# Patient Record
Sex: Female | Born: 1950 | Race: White | Hispanic: No | Marital: Married | Smoking: Never smoker
Health system: Southern US, Community
[De-identification: ages and names within clinical notes are randomized; demographics above are authoritative.]

## PROBLEM LIST (undated history)

## (undated) DIAGNOSIS — C801 Malignant (primary) neoplasm, unspecified: Secondary | ICD-10-CM

## (undated) HISTORY — PX: BREAST CYST ASPIRATION: SHX578

---

## 2006-02-17 ENCOUNTER — Ambulatory Visit: Payer: Self-pay | Admitting: Certified Nurse Midwife

## 2012-07-28 DIAGNOSIS — C801 Malignant (primary) neoplasm, unspecified: Secondary | ICD-10-CM

## 2012-07-28 HISTORY — DX: Malignant (primary) neoplasm, unspecified: C80.1

## 2012-12-02 ENCOUNTER — Ambulatory Visit: Payer: Self-pay | Admitting: Obstetrics and Gynecology

## 2013-01-07 DIAGNOSIS — Z905 Acquired absence of kidney: Secondary | ICD-10-CM | POA: Insufficient documentation

## 2013-01-11 DIAGNOSIS — N1330 Unspecified hydronephrosis: Secondary | ICD-10-CM | POA: Insufficient documentation

## 2013-01-11 DIAGNOSIS — I10 Essential (primary) hypertension: Secondary | ICD-10-CM | POA: Insufficient documentation

## 2013-02-10 DIAGNOSIS — C669 Malignant neoplasm of unspecified ureter: Secondary | ICD-10-CM | POA: Insufficient documentation

## 2013-05-02 ENCOUNTER — Ambulatory Visit: Payer: Self-pay | Admitting: Family Medicine

## 2013-06-01 ENCOUNTER — Ambulatory Visit: Payer: Self-pay | Admitting: Family Medicine

## 2015-03-02 IMAGING — US TRANSABDOMINAL ULTRASOUND OF PELVIS
1 series · 13 of 25 positions shown · non-contrast
Comparison: none

REASON FOR EXAM: PM Spotting Check Stripe
COMMENTS:

[Series 1: transabdominal ultrasound of pelvis · 0.30mm/px · 13 of 104 slices shown]
[im 1/104]
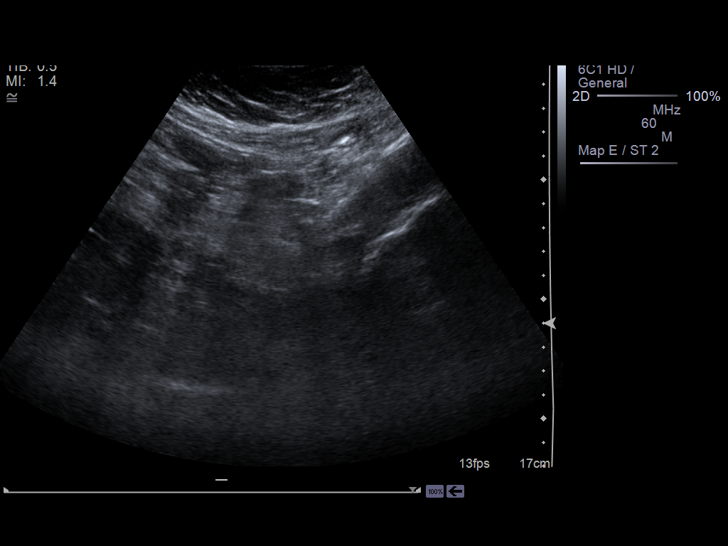
[im 9/104]
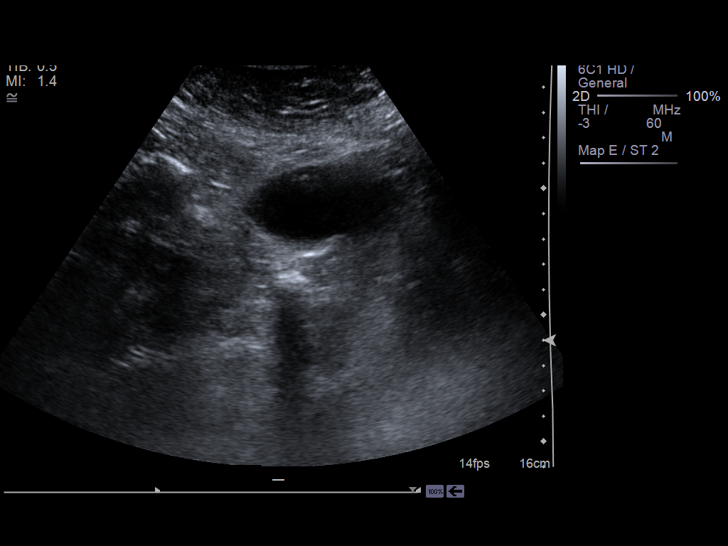
[im 18/104]
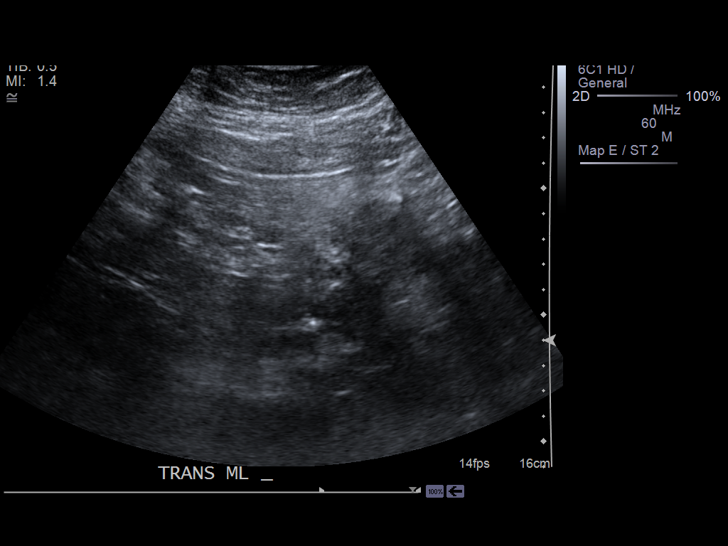
[im 26/104]
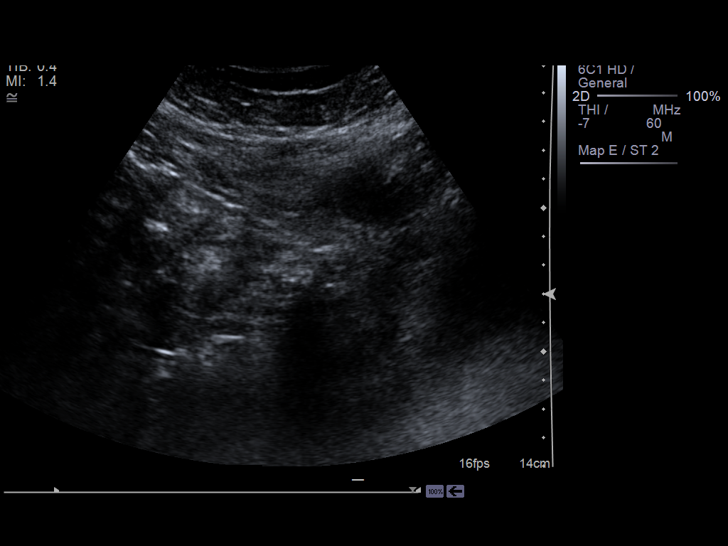
[im 35/104]
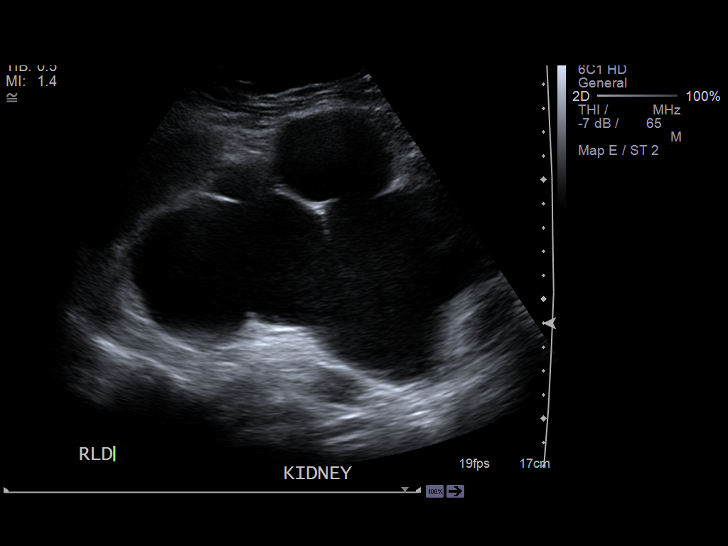
[im 43/104]
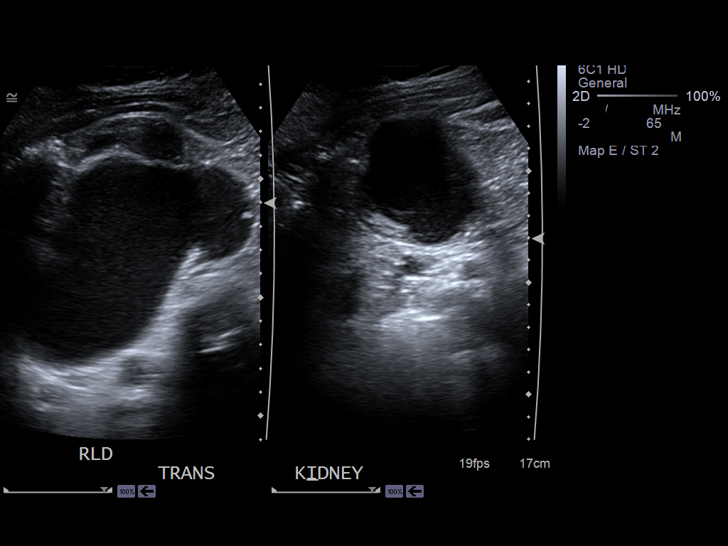
[im 52/104]
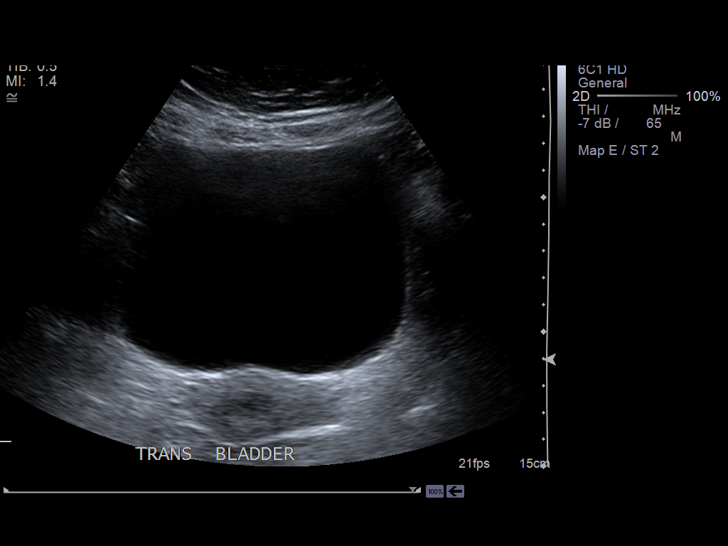
[im 61/104]
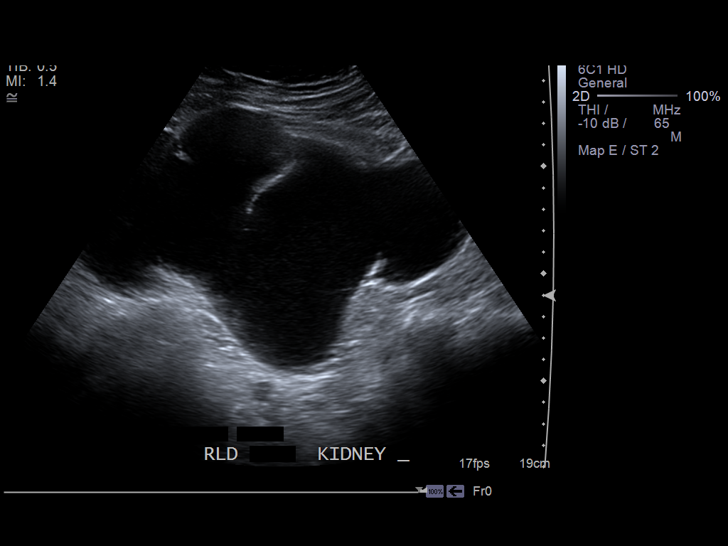
[im 69/104]
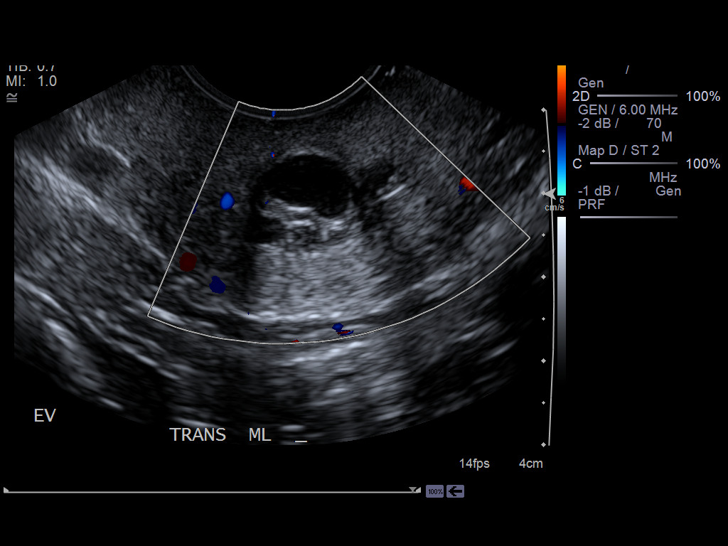
[im 78/104]
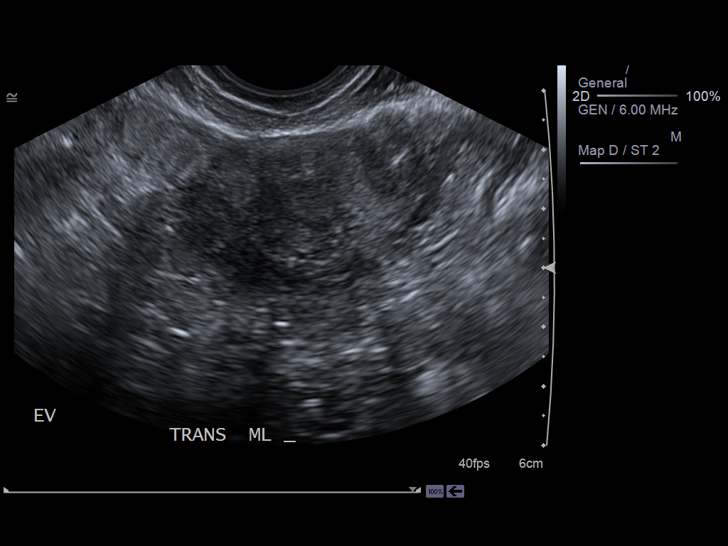
[im 86/104]
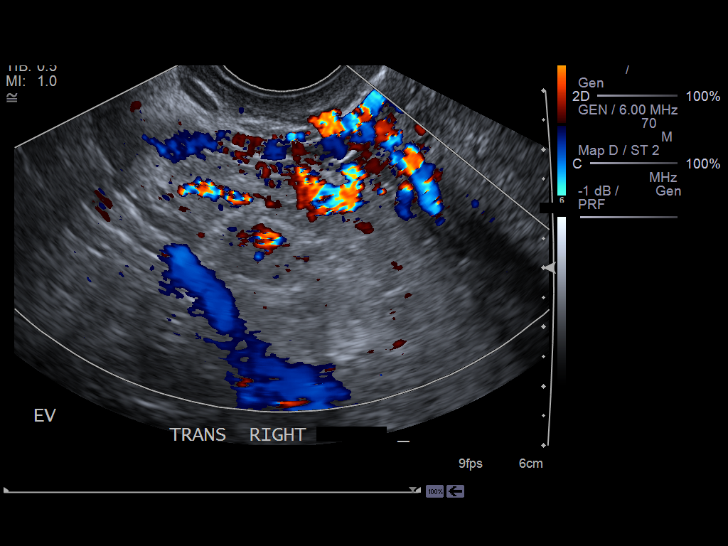
[im 95/104]
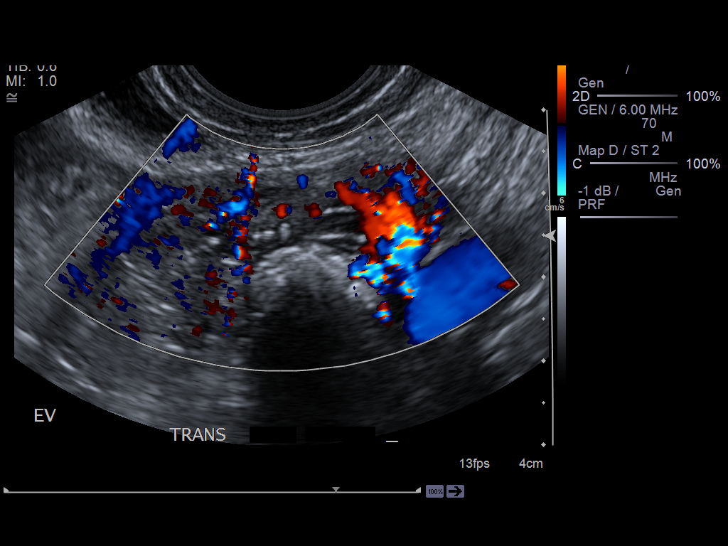
[im 104/104]
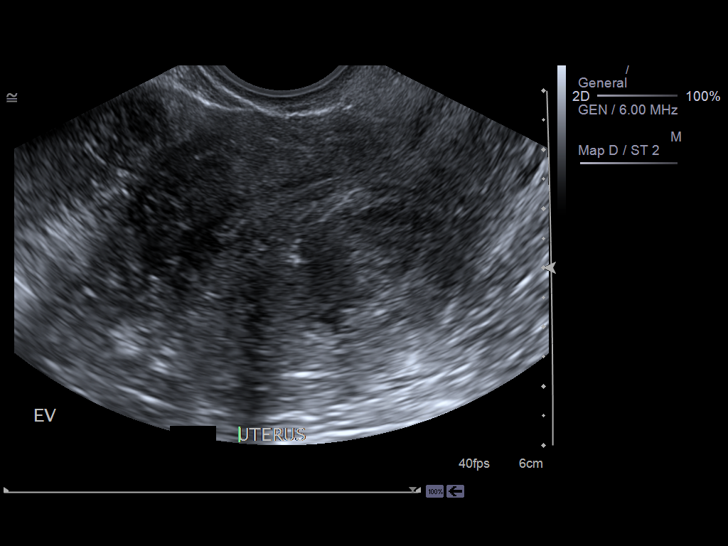

[13 of 25 positions shown; findings below may reference images not displayed]

PROCEDURE:     BETILIJA - BETILIJA PELVIS NON-OB W/TRANSVAGINAL  - December 02, 2012  [DATE]

RESULT:     Transabdominal and endovaginal pelvic sonogram is performed. The
there is severe left hydronephrosis with an ectatic ureter which is likely
indicative of chronic high-grade obstruction. Further investigation is
recommended. The right kidney appears normal. Right ureteral jet is noted at
the moderately distended urinary bladder. Left ureteral jet could not be
visualized. Right kidney length is 10.69 cm. Post void bladder volume is
20.9 cc consistent with a small residual volume present. The length of the
left kidney with the severely distended collecting system is the 18.82 cm.

There is a nabothian cyst present measuring approximately 9.5 to 10.9 mm in
length. Endometrial stripe thickness is 0.59 cm on endovaginal scanning. The
uterus itself is 9.28 x 4.92 x 5.95 cm. The myometrial echo texture shows
some areas of heterogeneous overall slightly increased echogenicity
including one posteriorly slightly to the left in the mid uterine region
measuring 3.02 x 1.63 x 2.53 cm. There is a second smaller area near the
fundus measuring 1.08 x 0.72 x 1.44 cm. These are suggestive of probable
leiomyomata. The right ovary could not be visualized on transabdominal or
endovaginal imaging. The left ovary measures 2.14 x 0.83 x 1.35 cm with
evidence by Doppler interrogation of blood flow.
IMPRESSION: 1. Likely chronic high-grade obstruction in the left ureter or ureteropelvic
junction. There is severe hydronephrosis. Further investigation with CT is
recommended. The right kidney appears normal.
2. Posterior and fundal fibroids as described. Endometrial stripe thickness
is 5.9 mm. There is no free fluid or abnormal fluid collection.
3. Normal appearing left ovary. Some minimal calcification may be present
within the left ovary.
4. The right ovary could not be demonstrated.
5. Small post void residual volume.
6. Nabothian cysts present.

[REDACTED]

## 2015-07-31 IMAGING — MG MM DIGITAL SCREENING BILAT W/ CAD
1 series · 4 of 4 positions shown · non-contrast
Comparison: Previous exam(s)

ACR Breast Density Category b: The breasts are scattered
fibroglandular.

REASON FOR EXAM: SCR MAMMO NO ORDER
COMMENTS:

PROCEDURE:     MAM - MAM DGTL SCRN MAM NO ORDER W/CAD  - May 02, 2013  [DATE]
CLINICAL DATA: Screening.
EXAM:
DIGITAL SCREENING BILATERAL MAMMOGRAM WITH CAD

[R CC · right · 4 of 4 slices shown]
[im 1/4]
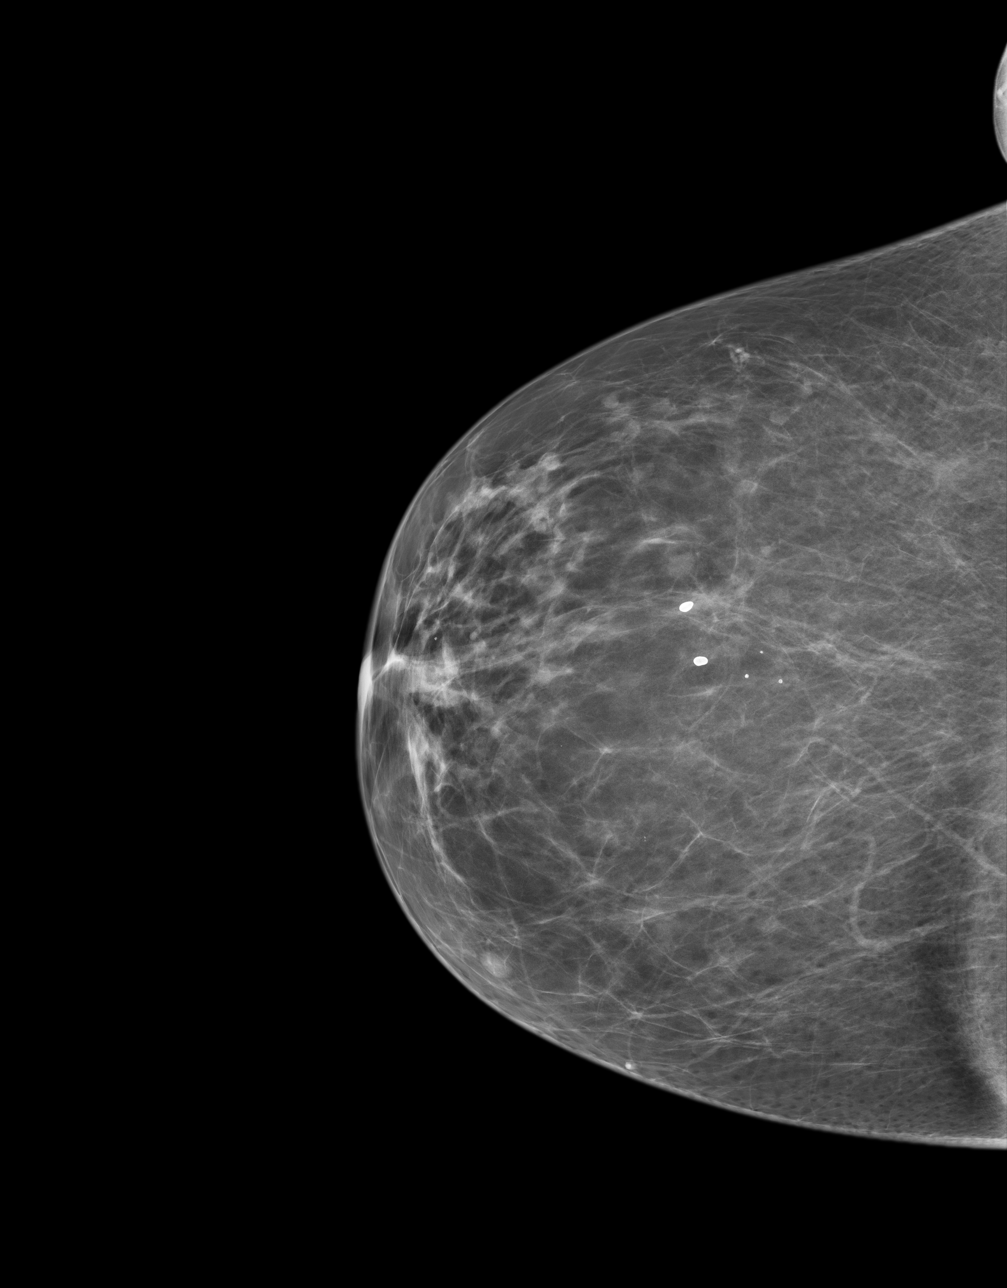
[im 2/4]
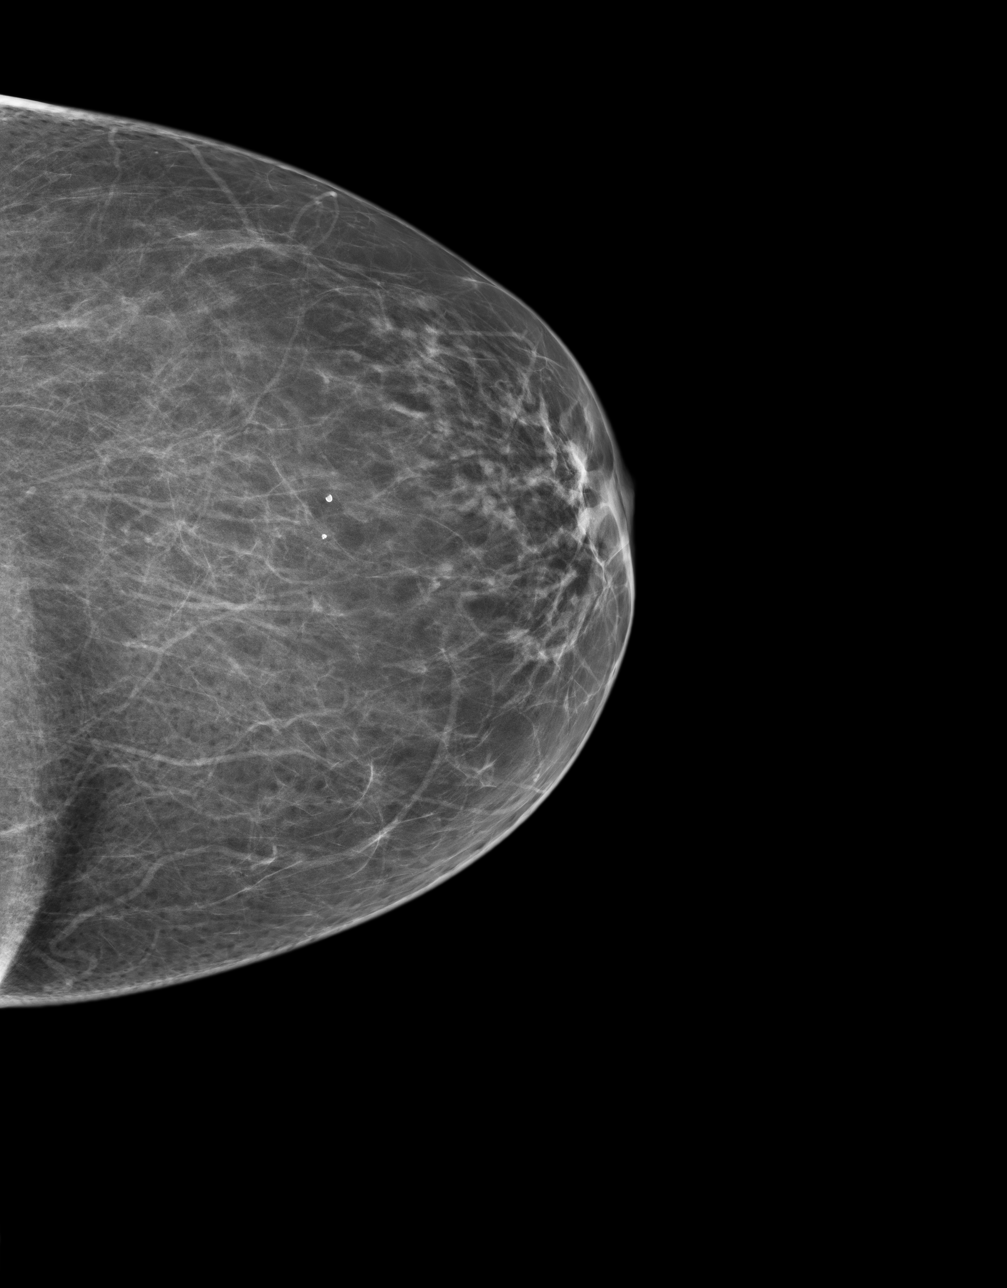
[im 3/4]
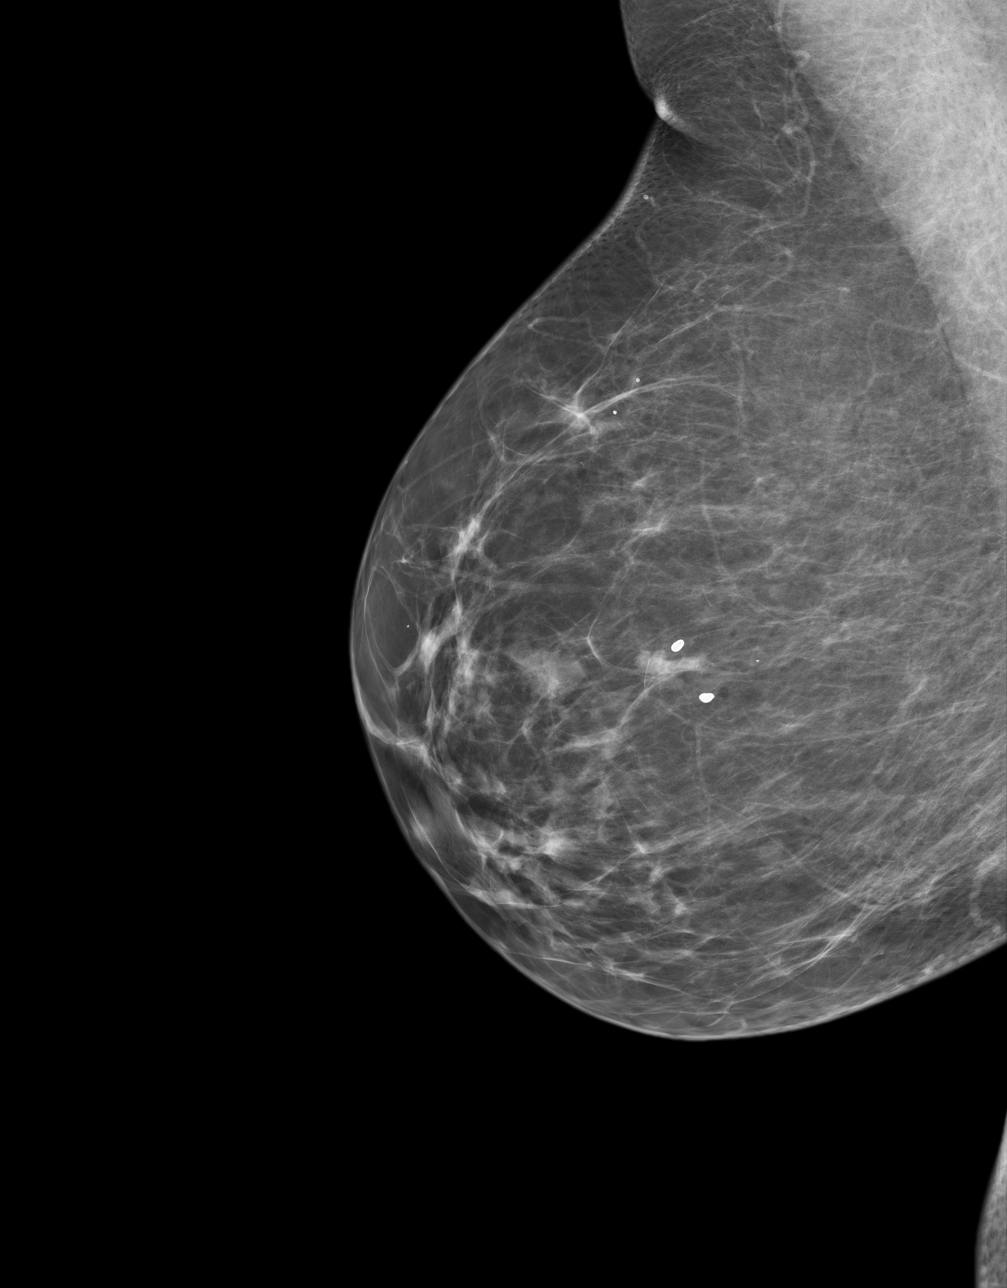
[im 4/4]
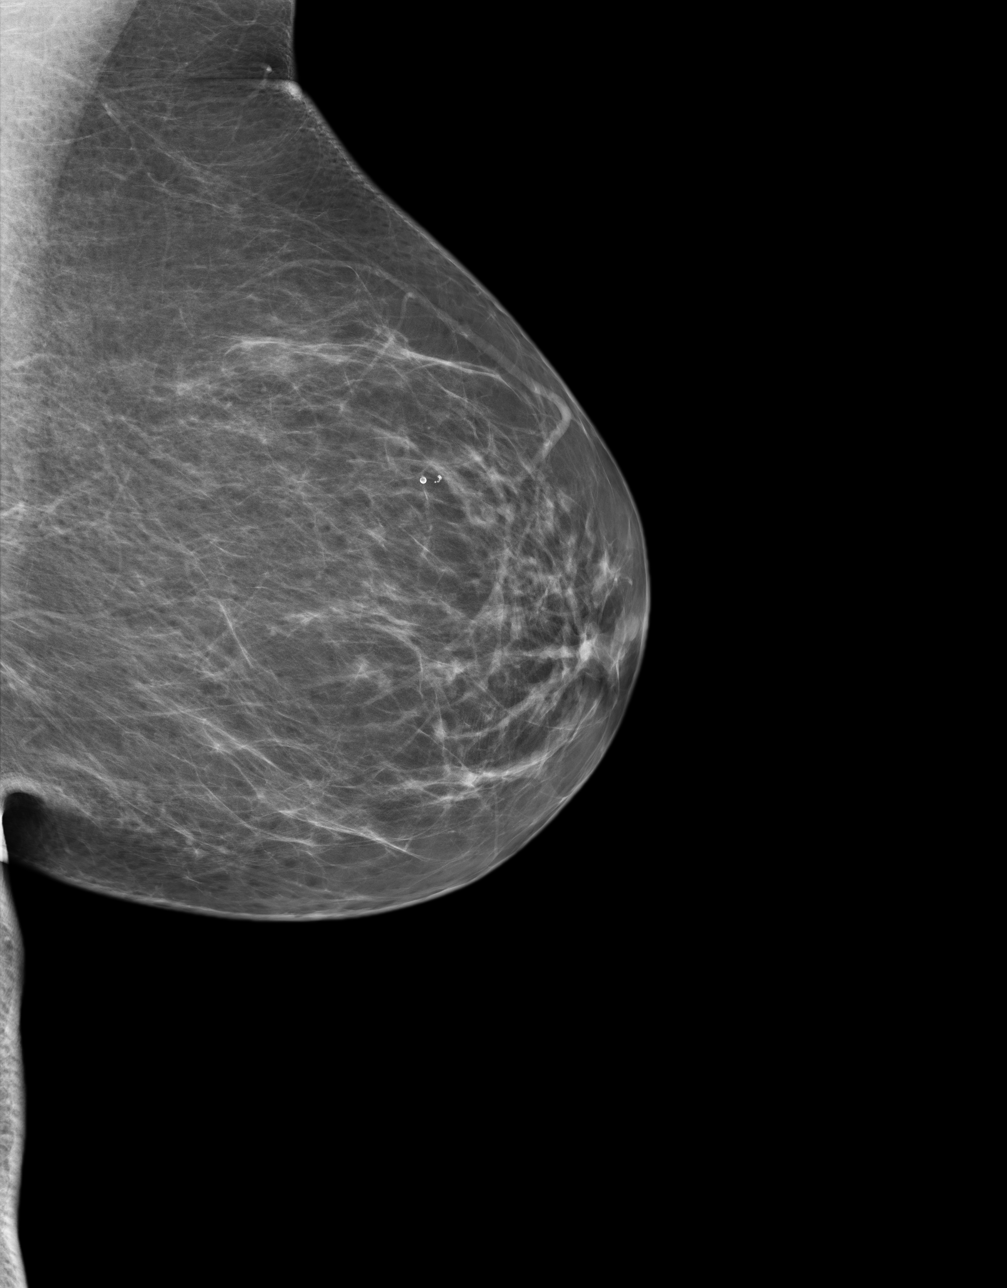

[4 of 4 positions shown; findings below may reference images not displayed]

FINDINGS: In the right breast, a possible mass warrants further evaluation
with spot compression views and possibly ultrasound. In the left
breast, no masses or malignant type calcifications are identified.
Images were processed with CAD.
IMPRESSION: Further evaluation is suggested for possible mass in the right
breast.

RECOMMENDATION:
Diagnostic mammogram and possibly ultrasound of the right breast.
(Code:[id number])

The patient will be contacted regarding the findings, and additional
imaging will be scheduled.

BI-RADS CATEGORY  0: Incomplete. Need additional imaging evaluation
and/or prior mammograms for comparison.

## 2015-11-26 DIAGNOSIS — E119 Type 2 diabetes mellitus without complications: Secondary | ICD-10-CM | POA: Insufficient documentation

## 2016-01-17 ENCOUNTER — Encounter: Payer: Self-pay | Admitting: Dietician

## 2016-01-17 ENCOUNTER — Encounter: Payer: BLUE CROSS/BLUE SHIELD | Attending: Family Medicine | Admitting: Dietician

## 2016-01-17 VITALS — Ht 64.0 in | Wt 184.1 lb

## 2016-01-17 DIAGNOSIS — E119 Type 2 diabetes mellitus without complications: Secondary | ICD-10-CM

## 2016-01-17 DIAGNOSIS — Z8619 Personal history of other infectious and parasitic diseases: Secondary | ICD-10-CM | POA: Insufficient documentation

## 2016-01-17 DIAGNOSIS — D249 Benign neoplasm of unspecified breast: Secondary | ICD-10-CM | POA: Insufficient documentation

## 2016-01-17 NOTE — Progress Notes (Signed)
Medical Nutrition Therapy: Visit start time: 1500  end time: 1600  Assessment:  Diagnosis: Type 2DM Past medical history: HTN, HLD Psychosocial issues/ stress concerns: reports stress at work and big decisions at home; craves chocolate/ comfort foods when stressed Preferred learning method:  . No preference indicated  Current weight: 184.1lbs  Height: 5'4" Medications, supplements: reviewed list in chart with patient  Progress and evaluation: Patient reports Diabetes diagnosis in 2014 after ureter surgery. Recent HbA1C of 9.5% per MD notes.         She reports increase in stress recently and less healthy food choices are likely reasons for increase in BGs.          She feels that wheat products and rice increase her blood sugar easily.          She would like to lose weight, her goal weight is 130lbs.    Physical activity: walking 30 minutes, 2 times a week.  Dietary Intake:  Usual eating pattern includes 2 meals and 0-1 snacks per day. Dining out frequency: 3 meals per week.  Breakfast: usually black coffee Snack: none Lunch: chicken or other meat (trying to limit breading), no-bread sandwiches except occasional biscuit or french fries.  Snack: none; occasional latte Supper: grilled chicken or fish, rice, sometimes beans, sometimes sandwich with sandwich round bread.  Snack: has been eating some Nutty bar ice cream cones, but has now stopped Beverages: water, unsweet tea, coffee. Does not use artificial sweeteners.   Nutrition Care Education: Topics covered: Diabetes, weight control Basic nutrition: basic food groups, appropriate nutrient balance, appropriate meal and snack schedule, general nutrition guidelines    Weight control: 1300kcal meal guide, behavioral changes for weight loss: portion control, food cravings, importance of lowfat and low sugar choices.  Diabetes:  goals for BGs, appropriate meal and snack schedule, appropriate carb intake and balance Other lifestyle  changes: role of exercise in BG control, BP control, and stress management  Nutritional Diagnosis:  [place]-3.3 Overweight/obesity As related to inactivity, excess calories.  As evidenced by patient report.  Intervention: Instruction as noted above.   Set goals with patient input.    Encouraged focus on balance of nutrients rather than avoiding any particular foods entirely.    Patient did not feel RD follow-up would be necessary at this time.  Education Materials given:  . General diet guidelines for Diabetes . Food lists/ Planning A Balanced Meal  Diabetes and You ([employer])  Carbohydrates and Meal Planning [employer]) . Goals/ instructions  Learner/ who was taught:  . Patient   Level of understanding: Marland Kitchen Verbalizes/ demonstrates competency  Demonstrated degree of understanding via:   Teach back Learning barriers: . None  Willingness to learn/ readiness for change: . Eager, change in progress  Monitoring and Evaluation:  Dietary intake, exercise, BG control, and body weight      follow up: prn

## 2016-01-17 NOTE — Patient Instructions (Signed)
   Start measuring food portions, especially of starches. Allow for generous portions of vegetables.   Try a small breakfast, such as oatmeal with a few nuts and small amount of fruit (refrigerator oatmeal: 1/2 cup oats + 3/4-1 cup milk or water, and allow to soften overnight in the fridge. Add fruit, nuts when you are ready to eat)  Include some exercise ideally 4 days a week. This will help with blood sugar, blood pressure, and stress.

## 2016-07-22 ENCOUNTER — Other Ambulatory Visit: Payer: Self-pay | Admitting: Family Medicine

## 2016-07-22 DIAGNOSIS — Z1231 Encounter for screening mammogram for malignant neoplasm of breast: Secondary | ICD-10-CM

## 2016-08-04 ENCOUNTER — Ambulatory Visit: Admission: RE | Admit: 2016-08-04 | Payer: BLUE CROSS/BLUE SHIELD | Source: Ambulatory Visit

## 2016-08-11 ENCOUNTER — Encounter: Payer: Self-pay | Admitting: Radiology

## 2016-08-11 ENCOUNTER — Ambulatory Visit
Admission: RE | Admit: 2016-08-11 | Discharge: 2016-08-11 | Disposition: A | Discharge status: Home / Self Care | Payer: Medicare Other | Source: Ambulatory Visit | Attending: Family Medicine | Admitting: Family Medicine

## 2016-08-11 DIAGNOSIS — Z1231 Encounter for screening mammogram for malignant neoplasm of breast: Secondary | ICD-10-CM

## 2016-08-11 HISTORY — DX: Malignant (primary) neoplasm, unspecified: C80.1

## 2016-08-18 ENCOUNTER — Other Ambulatory Visit: Payer: Self-pay | Admitting: Family Medicine

## 2016-08-18 DIAGNOSIS — R928 Other abnormal and inconclusive findings on diagnostic imaging of breast: Secondary | ICD-10-CM

## 2016-09-09 ENCOUNTER — Ambulatory Visit
Admission: RE | Admit: 2016-09-09 | Discharge: 2016-09-09 | Disposition: A | Discharge status: Home / Self Care | Payer: Medicare Other | Source: Ambulatory Visit | Attending: Family Medicine | Admitting: Family Medicine

## 2016-09-09 DIAGNOSIS — R921 Mammographic calcification found on diagnostic imaging of breast: Secondary | ICD-10-CM | POA: Diagnosis not present

## 2016-09-09 DIAGNOSIS — R928 Other abnormal and inconclusive findings on diagnostic imaging of breast: Secondary | ICD-10-CM

## 2019-07-05 ENCOUNTER — Other Ambulatory Visit: Payer: Self-pay

## 2019-07-05 DIAGNOSIS — Z20822 Contact with and (suspected) exposure to covid-19: Secondary | ICD-10-CM

## 2019-07-06 LAB — NOVEL CORONAVIRUS, NAA: SARS-CoV-2, NAA: NOT DETECTED

## 2019-07-20 ENCOUNTER — Ambulatory Visit: Payer: Self-pay

## 2019-08-22 ENCOUNTER — Ambulatory Visit (LOCAL_COMMUNITY_HEALTH_CENTER): Payer: Medicare Other

## 2019-08-22 ENCOUNTER — Other Ambulatory Visit: Payer: Self-pay

## 2019-08-22 DIAGNOSIS — Z23 Encounter for immunization: Secondary | ICD-10-CM

## 2019-08-22 NOTE — Progress Notes (Signed)
Prevnar given; tolerated well Charise Leinbach, RN  

## 2020-03-19 ENCOUNTER — Ambulatory Visit (LOCAL_COMMUNITY_HEALTH_CENTER): Payer: Medicare Other

## 2020-03-19 DIAGNOSIS — Z23 Encounter for immunization: Secondary | ICD-10-CM | POA: Diagnosis not present

## 2021-07-28 DIAGNOSIS — I219 Acute myocardial infarction, unspecified: Secondary | ICD-10-CM

## 2021-07-28 HISTORY — DX: Acute myocardial infarction, unspecified: I21.9

## 2021-08-10 DIAGNOSIS — I2102 ST elevation (STEMI) myocardial infarction involving left anterior descending coronary artery: Secondary | ICD-10-CM | POA: Insufficient documentation

## 2021-08-11 DIAGNOSIS — E785 Hyperlipidemia, unspecified: Secondary | ICD-10-CM | POA: Insufficient documentation

## 2021-08-11 DIAGNOSIS — I2510 Atherosclerotic heart disease of native coronary artery without angina pectoris: Secondary | ICD-10-CM | POA: Insufficient documentation

## 2021-08-13 DIAGNOSIS — I5022 Chronic systolic (congestive) heart failure: Secondary | ICD-10-CM | POA: Insufficient documentation

## 2021-09-09 ENCOUNTER — Other Ambulatory Visit: Payer: Self-pay

## 2021-09-09 ENCOUNTER — Encounter: Payer: Medicare Other | Attending: Cardiology | Admitting: *Deleted

## 2021-09-09 ENCOUNTER — Encounter: Payer: Self-pay | Admitting: *Deleted

## 2021-09-09 DIAGNOSIS — Z955 Presence of coronary angioplasty implant and graft: Secondary | ICD-10-CM | POA: Insufficient documentation

## 2021-09-09 DIAGNOSIS — I213 ST elevation (STEMI) myocardial infarction of unspecified site: Secondary | ICD-10-CM | POA: Insufficient documentation

## 2021-09-09 NOTE — Progress Notes (Signed)
Initial telephone orientation completed. Diagnosis can be found in [facility] 1/13. EP orientation scheduled for Wednesday 2/15 at 10:30

## 2021-09-11 ENCOUNTER — Other Ambulatory Visit: Payer: Self-pay

## 2021-09-11 VITALS — Ht 64.5 in | Wt 152.0 lb

## 2021-09-11 DIAGNOSIS — I213 ST elevation (STEMI) myocardial infarction of unspecified site: Secondary | ICD-10-CM

## 2021-09-11 DIAGNOSIS — Z955 Presence of coronary angioplasty implant and graft: Secondary | ICD-10-CM | POA: Diagnosis not present

## 2021-09-11 NOTE — Patient Instructions (Signed)
Patient Instructions  Patient Details  Name: Kara Dyer MRN: [id number] Date of Birth: [birth year] Referring Provider:  Joaquim Nam, MD  Below are your personal goals for exercise, nutrition, and risk factors. Our goal is to help you stay on track towards obtaining and maintaining these goals. We will be discussing your progress on these goals with you throughout the program.  Initial Exercise Prescription:  Initial Exercise Prescription - 09/11/21 1200       Date of Initial Exercise RX and Referring Provider   Date 09/11/21    Referring Provider Lillia Corporal, Artelia Laroche MD      Oxygen   Maintain Oxygen Saturation 88% or higher      NuStep   Level 2    SPM 80    Minutes 15    METs 2.7      T5 Nustep   Level 1    SPM 80    Minutes 15    METs 2.7      Biostep-RELP   Level 1    SPM 50    Minutes 15    METs 2.7      Track   Laps 28    Minutes 15    METs 2.52      Prescription Details   Frequency (times per week) 3    Duration Progress to 30 minutes of continuous aerobic without signs/symptoms of physical distress      Intensity   THRR 40-80% of Max Heartrate 100 - 133    Ratings of Perceived Exertion 11-13    Perceived Dyspnea 0-4      Progression   Progression Continue to progress workloads to maintain intensity without signs/symptoms of physical distress.      Resistance Training   Training Prescription Yes    Weight 3 lb    Reps 10-15             Exercise Goals: Frequency: Be able to perform aerobic exercise two to three times per week in program working toward 2-5 days per week of home exercise.  Intensity: Work with a perceived exertion of 11 (fairly light) - 15 (hard) while following your exercise prescription.  We will make changes to your prescription with you as you progress through the program.   Duration: Be able to do 30 to 45 minutes of continuous aerobic exercise in addition to a 5 minute warm-up and a 5 minute cool-down routine.    Nutrition Goals: Your personal nutrition goals will be established when you do your nutrition analysis with the dietician.  The following are general nutrition guidelines to follow: Cholesterol < 200mg /day Sodium < 1500mg /day Fiber: Women over 50 yrs - 21 grams per day  Personal Goals:  Personal Goals and Risk Factors at Admission - 09/11/21 1233       Core Components/Risk Factors/Patient Goals on Admission    Weight Management Yes;Weight Maintenance    Intervention Weight Management: Develop a combined nutrition and exercise program designed to reach desired caloric intake, while maintaining appropriate intake of nutrient and fiber, sodium and fats, and appropriate energy expenditure required for the weight goal.;Weight Management: Provide education and appropriate resources to help participant work on and attain dietary goals.;Weight Management/Obesity: Establish reasonable short term and long term weight goals.    Admit Weight 152 lb (68.9 kg)    Goal Weight: Short Term 152 lb (68.9 kg)    Goal Weight: Long Term 152 lb (68.9 kg)    Expected Outcomes Short Term: Continue to  assess and modify interventions until short term weight is achieved;Long Term: Adherence to nutrition and physical activity/exercise program aimed toward attainment of established weight goal;Weight Maintenance: Understanding of the daily nutrition guidelines, which includes 25-35% calories from fat, 7% or less cal from saturated fats, less than 200mg  cholesterol, less than 1.5gm of sodium, & 5 or more servings of fruits and vegetables daily;Understanding recommendations for meals to include 15-35% energy as protein, 25-35% energy from fat, 35-60% energy from carbohydrates, less than 200mg  of dietary cholesterol, 20-35 gm of total fiber daily;Understanding of distribution of calorie intake throughout the day with the consumption of 4-5 meals/snacks    Diabetes Yes    Intervention Provide education about signs/symptoms and  action to take for hypo/hyperglycemia.;Provide education about proper nutrition, including hydration, and aerobic/resistive exercise prescription along with prescribed medications to achieve blood glucose in normal ranges: Fasting glucose 65-99 mg/dL    Expected Outcomes Short Term: Participant verbalizes understanding of the signs/symptoms and immediate care of hyper/hypoglycemia, proper foot care and importance of medication, aerobic/resistive exercise and nutrition plan for blood glucose control.;Long Term: Attainment of HbA1C < 7%.    Hypertension Yes    Intervention Provide education on lifestyle modifcations including regular physical activity/exercise, weight management, moderate sodium restriction and increased consumption of fresh fruit, vegetables, and low fat dairy, alcohol moderation, and smoking cessation.;Monitor prescription use compliance.    Lipids Yes    Intervention Provide education and support for participant on nutrition & aerobic/resistive exercise along with prescribed medications to achieve LDL 70mg , HDL >40mg .    Expected Outcomes Short Term: Participant states understanding of desired cholesterol values and is compliant with medications prescribed. Participant is following exercise prescription and nutrition guidelines.;Long Term: Cholesterol controlled with medications as prescribed, with individualized exercise RX and with personalized nutrition plan. Value goals: LDL < 70mg , HDL > 40 mg.             Tobacco Use Initial Evaluation: Social History   Tobacco Use  Smoking Status Never  Smokeless Tobacco Not on file    Exercise Goals and Review:  Exercise Goals     Row Name 09/11/21 1232             Exercise Goals   Increase Physical Activity Yes       Intervention Provide advice, education, support and counseling about physical activity/exercise needs.;Develop an individualized exercise prescription for aerobic and resistive training based on initial  evaluation findings, risk stratification, comorbidities and participant's personal goals.       Expected Outcomes Short Term: Attend rehab on a regular basis to increase amount of physical activity.;Long Term: Add in home exercise to make exercise part of routine and to increase amount of physical activity.;Long Term: Exercising regularly at least 3-5 days a week.       Increase Strength and Stamina Yes       Intervention Provide advice, education, support and counseling about physical activity/exercise needs.;Develop an individualized exercise prescription for aerobic and resistive training based on initial evaluation findings, risk stratification, comorbidities and participant's personal goals.       Expected Outcomes Short Term: Increase workloads from initial exercise prescription for resistance, speed, and METs.;Short Term: Perform resistance training exercises routinely during rehab and add in resistance training at home;Long Term: Improve cardiorespiratory fitness, muscular endurance and strength as measured by increased METs and functional capacity (6MWT)       Able to understand and use rate of perceived exertion (RPE) scale Yes  Intervention Provide education and explanation on how to use RPE scale       Expected Outcomes Short Term: Able to use RPE daily in rehab to express subjective intensity level;Long Term:  Able to use RPE to guide intensity level when exercising independently       Able to understand and use Dyspnea scale Yes       Intervention Provide education and explanation on how to use Dyspnea scale       Expected Outcomes Short Term: Able to use Dyspnea scale daily in rehab to express subjective sense of shortness of breath during exertion;Long Term: Able to use Dyspnea scale to guide intensity level when exercising independently       Knowledge and understanding of Target Heart Rate Range (THRR) Yes       Intervention Provide education and explanation of THRR including how  the numbers were predicted and where they are located for reference       Expected Outcomes Short Term: Able to state/look up THRR;Long Term: Able to use THRR to govern intensity when exercising independently;Short Term: Able to use daily as guideline for intensity in rehab       Able to check pulse independently Yes       Intervention Provide education and demonstration on how to check pulse in carotid and radial arteries.;Review the importance of being able to check your own pulse for safety during independent exercise       Expected Outcomes Short Term: Able to explain why pulse checking is important during independent exercise;Long Term: Able to check pulse independently and accurately       Understanding of Exercise Prescription Yes       Intervention Provide education, explanation, and written materials on patient's individual exercise prescription       Expected Outcomes Short Term: Able to explain program exercise prescription;Long Term: Able to explain home exercise prescription to exercise independently                Copy of goals given to participant.

## 2021-09-11 NOTE — Progress Notes (Signed)
Cardiac Individual Treatment Plan  Patient Details  Name: Kara Dyer MRN: [id number] Date of Birth: 1950/12/01 Referring Provider:   Flowsheet Row Cardiac Rehab from 09/11/2021 in [facility] Cardiac and Pulmonary Rehab  Referring Provider Kara Dyer, Kara Laroche MD       Initial Encounter Date:  Flowsheet Row Cardiac Rehab from 09/11/2021 in [facility] Cardiac and Pulmonary Rehab  Date 09/11/21       Visit Diagnosis: ST elevation myocardial infarction (STEMI), unspecified artery (Kara Dyer)  Status post coronary artery stent placement  Patient's Home Medications on Admission:  Current Outpatient Medications:    amLODipine (NORVASC) 10 MG tablet, , Disp: , Rfl: 1   aspirin 81 MG EC tablet, Take by mouth., Disp: , Rfl:    aspirin EC 81 MG tablet, Take by mouth., Disp: , Rfl:    clopidogrel (PLAVIX) 75 MG tablet, Take 75 mg by mouth daily., Disp: , Rfl:    glipiZIDE (GLUCOTROL) 5 MG tablet, Take 5 mg by mouth daily., Disp: , Rfl:    losartan (COZAAR) 25 MG tablet, Take 25 mg by mouth daily., Disp: , Rfl:    metFORMIN (GLUCOPHAGE) 500 MG tablet, , Disp: , Rfl: 4   metoprolol succinate (TOPROL-XL) 50 MG 24 hr tablet, Take 1 tablet by mouth daily., Disp: , Rfl:    Multiple Vitamin (MULTI-VITAMINS) TABS, Take by mouth., Disp: , Rfl:    nitroGLYCERIN (NITROSTAT) 0.4 MG SL tablet, SMARTSIG:1 Tablet(s) Sublingual PRN, Disp: , Rfl:    spironolactone (ALDACTONE) 25 MG tablet, Take 12.5 mg by mouth daily., Disp: , Rfl:   Past Medical History: Past Medical History:  Diagnosis Date   Cancer ([place]) 2014   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      Exercise Target Goals: Exercise Program Goal: Individual exercise prescription set using results from initial 6 min walk test and THRR while considering  patients  activity barriers and safety.   Exercise Prescription Goal: Initial exercise prescription builds to 30-45 minutes a day of aerobic activity, 2-3 days per week.  Home exercise guidelines will be given to patient during program as part of exercise prescription that the participant will acknowledge.   Education: Aerobic Exercise: - Group verbal and visual presentation on the components of exercise prescription. Introduces F.I.T.T principle from ACSM for exercise prescriptions.  Reviews F.I.T.T. principles of aerobic exercise including progression. Written material given at graduation. Flowsheet Row Cardiac Rehab from 09/11/2021 in [facility] Cardiac and Pulmonary Rehab  Education need identified 09/11/21       Education: Resistance Exercise: - Group verbal and visual presentation on the components of exercise prescription. Introduces F.I.T.T principle from ACSM for exercise prescriptions  Reviews F.I.T.T. principles of resistance exercise including progression. Written material given at graduation.    Education: Exercise & Equipment Safety: - Individual verbal instruction and demonstration of equipment use and safety with use of the equipment. Flowsheet Row Cardiac Rehab from 09/11/2021 in [facility] Cardiac and Pulmonary Rehab  Education need identified 09/11/21  Date 09/11/21  Educator [place]  Instruction Review Code 1- Verbalizes Understanding       Education: Exercise Physiology & General Exercise Guidelines: - Group verbal and written instruction with models to review the exercise physiology of the cardiovascular system and associated critical values. Provides general exercise guidelines with specific  guidelines to those with heart or lung disease.  Flowsheet Row Cardiac Rehab from 09/11/2021 in [facility] Cardiac and Pulmonary Rehab  Education need identified 09/11/21       Education: Flexibility, Balance, Mind/Body Relaxation: - Group verbal and visual presentation with interactive activity on the  components of exercise prescription. Introduces F.I.T.T principle from ACSM for exercise prescriptions. Reviews F.I.T.T. principles of flexibility and balance exercise training including progression. Also discusses the mind body connection.  Reviews various relaxation techniques to help reduce and manage stress (i.e. Deep breathing, progressive muscle relaxation, and visualization). Balance handout provided to take home. Written material given at graduation.   Activity Barriers & Risk Stratification:  Activity Barriers & Cardiac Risk Stratification - 09/11/21 1208       Activity Barriers & Cardiac Risk Stratification   Activity Barriers Joint Problems;Other (comment);Muscular Weakness    Comments Bilateral knee pain, neuropathy    Cardiac Risk Stratification Moderate             6 Minute Walk:  6 Minute Walk     Row Name 09/11/21 1210         6 Minute Walk   Phase Initial     Distance 1100 feet     Walk Time 6 minutes     # of Rest Breaks 0     MPH 2.08     METS 2.72     RPE 11     Perceived Dyspnea  0     VO2 Peak 9.53     Symptoms Yes (comment)     Comments left knee pain 2/10     Resting HR 67 bpm     Resting BP 140/72     Resting Oxygen Saturation  99 %     Exercise Oxygen Saturation  during 6 min walk 98 %     Max Ex. HR 90 bpm     Max Ex. BP 164/74     2 Minute Post BP 144/76              Oxygen Initial Assessment:   Oxygen Re-Evaluation:   Oxygen Discharge (Final Oxygen Re-Evaluation):   Initial Exercise Prescription:  Initial Exercise Prescription - 09/11/21 1200       Date of Initial Exercise RX and Referring Provider   Date 09/11/21    Referring Provider Kara Dyer, Kara Laroche MD      Oxygen   Maintain Oxygen Saturation 88% or higher      NuStep   Level 2    SPM 80    Minutes 15    METs 2.7      T5 Nustep   Level 1    SPM 80    Minutes 15    METs 2.7      Biostep-RELP   Level 1    SPM 50    Minutes 15    METs 2.7       Track   Laps 28    Minutes 15    METs 2.52      Prescription Details   Frequency (times per week) 3    Duration Progress to 30 minutes of continuous aerobic without signs/symptoms of physical distress      Intensity   THRR 40-80% of Max Heartrate 100 - 133    Ratings of Perceived Exertion 11-13    Perceived Dyspnea 0-4      Progression   Progression Continue to progress workloads to maintain intensity without signs/symptoms of physical distress.  Resistance Training   Training Prescription Yes    Weight 3 lb    Reps 10-15             Perform Capillary Blood Glucose checks as needed.  Exercise Prescription Changes:   Exercise Prescription Changes     Row Name 09/11/21 1200             Response to Exercise   Blood Pressure (Admit) 140/72       Blood Pressure (Exercise) 164/74       Blood Pressure (Exit) 144/76       Heart Rate (Admit) 67 bpm       Heart Rate (Exercise) 90 bpm       Heart Rate (Exit) 71 bpm       Oxygen Saturation (Admit) 99 %       Oxygen Saturation (Exercise) 98 %       Rating of Perceived Exertion (Exercise) 11       Perceived Dyspnea (Exercise) 0       Symptoms Left knee pain 2/10       Comments walk test results                Exercise Comments:   Exercise Goals and Review:   Exercise Goals     Row Name 09/11/21 1232             Exercise Goals   Increase Physical Activity Yes       Intervention Provide advice, education, support and counseling about physical activity/exercise needs.;Develop an individualized exercise prescription for aerobic and resistive training based on initial evaluation findings, risk stratification, comorbidities and participant's personal goals.       Expected Outcomes Short Term: Attend rehab on a regular basis to increase amount of physical activity.;Long Term: Add in home exercise to make exercise part of routine and to increase amount of physical activity.;Long Term: Exercising regularly at  least 3-5 days a week.       Increase Strength and Stamina Yes       Intervention Provide advice, education, support and counseling about physical activity/exercise needs.;Develop an individualized exercise prescription for aerobic and resistive training based on initial evaluation findings, risk stratification, comorbidities and participant's personal goals.       Expected Outcomes Short Term: Increase workloads from initial exercise prescription for resistance, speed, and METs.;Short Term: Perform resistance training exercises routinely during rehab and add in resistance training at home;Long Term: Improve cardiorespiratory fitness, muscular endurance and strength as measured by increased METs and functional capacity (6MWT)       Able to understand and use rate of perceived exertion (RPE) scale Yes       Intervention Provide education and explanation on how to use RPE scale       Expected Outcomes Short Term: Able to use RPE daily in rehab to express subjective intensity level;Long Term:  Able to use RPE to guide intensity level when exercising independently       Able to understand and use Dyspnea scale Yes       Intervention Provide education and explanation on how to use Dyspnea scale       Expected Outcomes Short Term: Able to use Dyspnea scale daily in rehab to express subjective sense of shortness of breath during exertion;Long Term: Able to use Dyspnea scale to guide intensity level when exercising independently       Knowledge and understanding of Target Heart Rate Range (THRR) Yes  Intervention Provide education and explanation of THRR including how the numbers were predicted and where they are located for reference       Expected Outcomes Short Term: Able to state/look up THRR;Long Term: Able to use THRR to govern intensity when exercising independently;Short Term: Able to use daily as guideline for intensity in rehab       Able to check pulse independently Yes       Intervention  Provide education and demonstration on how to check pulse in carotid and radial arteries.;Review the importance of being able to check your own pulse for safety during independent exercise       Expected Outcomes Short Term: Able to explain why pulse checking is important during independent exercise;Long Term: Able to check pulse independently and accurately       Understanding of Exercise Prescription Yes       Intervention Provide education, explanation, and written materials on patient's individual exercise prescription       Expected Outcomes Short Term: Able to explain program exercise prescription;Long Term: Able to explain home exercise prescription to exercise independently                Exercise Goals Re-Evaluation :   Discharge Exercise Prescription (Final Exercise Prescription Changes):  Exercise Prescription Changes - 09/11/21 1200       Response to Exercise   Blood Pressure (Admit) 140/72    Blood Pressure (Exercise) 164/74    Blood Pressure (Exit) 144/76    Heart Rate (Admit) 67 bpm    Heart Rate (Exercise) 90 bpm    Heart Rate (Exit) 71 bpm    Oxygen Saturation (Admit) 99 %    Oxygen Saturation (Exercise) 98 %    Rating of Perceived Exertion (Exercise) 11    Perceived Dyspnea (Exercise) 0    Symptoms Left knee pain 2/10    Comments walk test results             Nutrition:  Target Goals: Understanding of nutrition guidelines, daily intake of sodium 1500mg , cholesterol 200mg , calories 30% from fat and 7% or less from saturated fats, daily to have 5 or more servings of fruits and vegetables.  Education: All About Nutrition: -Group instruction provided by verbal, written material, interactive activities, discussions, models, and posters to present general guidelines for heart healthy nutrition including fat, fiber, MyPlate, the role of sodium in heart healthy nutrition, utilization of the nutrition label, and utilization of this knowledge for meal planning.  Follow up email sent as well. Written material given at graduation. Flowsheet Row Cardiac Rehab from 09/11/2021 in [facility] Cardiac and Pulmonary Rehab  Education need identified 09/11/21       Biometrics:  Pre Biometrics - 09/11/21 1208       Pre Biometrics   Height 5' 4.5" (1.638 m)    Weight 152 lb (68.9 kg)    BMI (Calculated) 25.7    Single Leg Stand 1.5 seconds              Nutrition Therapy Plan and Nutrition Goals:  Nutrition Therapy & Goals - 09/11/21 1212       Intervention Plan   Intervention Prescribe, educate and counsel regarding individualized specific dietary modifications aiming towards targeted core components such as weight, hypertension, lipid management, diabetes, heart failure and other comorbidities.    Expected Outcomes Short Term Goal: Understand basic principles of dietary content, such as calories, fat, sodium, cholesterol and nutrients.;Short Term Goal: A plan has been developed with personal nutrition  goals set during dietitian appointment.;Long Term Goal: Adherence to prescribed nutrition plan.             Nutrition Assessments:  MEDIFICTS Score Key: ?70 Need to make dietary changes  40-70 Heart Healthy Diet ? 40 Therapeutic Level Cholesterol Diet  Flowsheet Row Cardiac Rehab from 09/11/2021 in [facility] Cardiac and Pulmonary Rehab  Picture Your Plate Total Score on Admission 79      Picture Your Plate Scores: <11 Unhealthy dietary pattern with much room for improvement. 41-50 Dietary pattern unlikely to meet recommendations for good health and room for improvement. 51-60 More healthful dietary pattern, with some room for improvement.  >60 Healthy dietary pattern, although there may be some specific behaviors that could be improved.    Nutrition Goals Re-Evaluation:   Nutrition Goals Discharge (Final Nutrition Goals Re-Evaluation):   Psychosocial: Target Goals: Acknowledge presence or absence of significant depression and/or stress,  maximize coping skills, provide positive support system. Participant is able to verbalize types and ability to use techniques and skills needed for reducing stress and depression.   Education: Stress, Anxiety, and Depression - Group verbal and visual presentation to define topics covered.  Reviews how body is impacted by stress, anxiety, and depression.  Also discusses healthy ways to reduce stress and to treat/manage anxiety and depression.  Written material given at graduation.   Education: Sleep Hygiene -Provides group verbal and written instruction about how sleep can affect your health.  Define sleep hygiene, discuss sleep cycles and impact of sleep habits. Review good sleep hygiene tips.    Initial Review & Psychosocial Screening:  Initial Psych Review & Screening - 09/09/21 1412       Initial Review   Current issues with Current Stress Concerns    Source of Stress Concerns Family      Family Dynamics   Good Support System? Yes   husband; two daughters     Barriers   Psychosocial barriers to participate in program There are no identifiable barriers or psychosocial needs.      Screening Interventions   Interventions Encouraged to exercise;Provide feedback about the scores to participant;To provide support and resources with identified psychosocial needs    Expected Outcomes Short Term goal: Utilizing psychosocial counselor, staff and physician to assist with identification of specific Stressors or current issues interfering with healing process. Setting desired goal for each stressor or current issue identified.;Long Term Goal: Stressors or current issues are controlled or eliminated.;Short Term goal: Identification and review with participant of any Quality of Life or Depression concerns found by scoring the questionnaire.;Long Term goal: The participant improves quality of Life and PHQ9 Scores as seen by post scores and/or verbalization of changes             Quality of Life  Scores:   Quality of Life - 09/11/21 1211       Quality of Life   Select Quality of Life      Quality of Life Scores   Health/Function Pre 24.8 %    Socioeconomic Pre 22.5 %    Psych/Spiritual Pre 28.93 %    Family Pre 27.6 %    GLOBAL Pre 25.5 %            Scores of 19 and below usually indicate a poorer quality of life in these areas.  A difference of  2-3 points is a clinically meaningful difference.  A difference of 2-3 points in the total score of the Quality of Life Index has been  associated with significant improvement in overall quality of life, self-image, physical symptoms, and general health in studies assessing change in quality of life.  PHQ-9: Recent Review Flowsheet Data     Depression screen [facility] 2/9 09/11/2021 01/17/2016   Decreased Interest 0 0   Down, Depressed, Hopeless 0 0   PHQ - 2 Score 0 0   Altered sleeping 0 -   Tired, decreased energy 0 -   Change in appetite 0 -   Feeling bad or failure about yourself  0 -   Trouble concentrating 0 -   Moving slowly or fidgety/restless 0 -   Suicidal thoughts 0 -   PHQ-9 Score 0 -   Difficult doing work/chores Not difficult at all -      Interpretation of Total Score  Total Score Depression Severity:  1-4 = Minimal depression, 5-9 = Mild depression, 10-14 = Moderate depression, 15-19 = Moderately severe depression, 20-27 = Severe depression   Psychosocial Evaluation and Intervention:  Psychosocial Evaluation - 09/09/21 1418       Psychosocial Evaluation & Interventions   Comments Kendyl is coming to cardiac rehab post STEMI with stent. She states she feels so much better and didn't realize how bad she had felt until after her stent. She did have a set back with side effects from [place], but now that she has been switched to Plavix she has felt a lot better. She does state that she has not cared for herself like she should have in the past because she has helped with her grandkids and has been the caretaker  for both her and her husband's parents and her sister before they passed. She said this has been a wake up call and she is ready to take care of herself. Her husband has been very supportive and walking with her. Her two daughters are also a great support system. Her biggest stress concern is her grandson who is undergoing testing for autism. She states they are all supportive of each other and knows they will continue to support each other throughout the process. She is very motivated to attend the program so she can work on developing a healthy lifestyle    Expected Outcomes Short: attend cardiac rehab for education and exercise. Long; develop and maintain positive self care habits.    Continue Psychosocial Services  Follow up required by staff             Psychosocial Re-Evaluation:   Psychosocial Discharge (Final Psychosocial Re-Evaluation):   Vocational Rehabilitation: Provide vocational rehab assistance to qualifying candidates.   Vocational Rehab Evaluation & Intervention:  Vocational Rehab - 09/09/21 1412       Initial Vocational Rehab Evaluation & Intervention   Assessment shows need for Vocational Rehabilitation No             Education: Education Goals: Education classes will be provided on a variety of topics geared toward better understanding of heart health and risk factor modification. Participant will state understanding/return demonstration of topics presented as noted by education test scores.  Learning Barriers/Preferences:  Learning Barriers/Preferences - 09/09/21 1412       Learning Barriers/Preferences   Learning Barriers None    Learning Preferences None             General Cardiac Education Topics:  AED/CPR: - Group verbal and written instruction with the use of models to demonstrate the basic use of the AED with the basic ABC's of resuscitation.   Anatomy and Cardiac Procedures: -  Group verbal and visual presentation and models provide  information about basic cardiac anatomy and function. Reviews the testing methods done to diagnose heart disease and the outcomes of the test results. Describes the treatment choices: Medical Management, Angioplasty, or Coronary Bypass Surgery for treating various heart conditions including Myocardial Infarction, Angina, Valve Disease, and Cardiac Arrhythmias.  Written material given at graduation. Flowsheet Row Cardiac Rehab from 09/11/2021 in [facility] Cardiac and Pulmonary Rehab  Education need identified 09/11/21       Medication Safety: - Group verbal and visual instruction to review commonly prescribed medications for heart and lung disease. Reviews the medication, class of the drug, and side effects. Includes the steps to properly store meds and maintain the prescription regimen.  Written material given at graduation.   Intimacy: - Group verbal instruction through game format to discuss how heart and lung disease can affect sexual intimacy. Written material given at graduation..   Know Your Numbers and Heart Failure: - Group verbal and visual instruction to discuss disease risk factors for cardiac and pulmonary disease and treatment options.  Reviews associated critical values for Overweight/Obesity, Hypertension, Cholesterol, and Diabetes.  Discusses basics of heart failure: signs/symptoms and treatments.  Introduces Heart Failure Zone chart for action plan for heart failure.  Written material given at graduation.   Infection Prevention: - Provides verbal and written material to individual with discussion of infection control including proper hand washing and proper equipment cleaning during exercise session. Flowsheet Row Cardiac Rehab from 09/11/2021 in [facility] Cardiac and Pulmonary Rehab  Education need identified 09/11/21  Date 09/11/21  Educator [place]  Instruction Review Code 1- Verbalizes Understanding       Falls Prevention: - Provides verbal and written material to individual with  discussion of falls prevention and safety. Flowsheet Row Cardiac Rehab from 09/11/2021 in [facility] Cardiac and Pulmonary Rehab  Education need identified 09/11/21  Date 09/11/21  Educator [place]  Instruction Review Code 1- Verbalizes Understanding       Other: -Provides group and verbal instruction on various topics (see comments)   Knowledge Questionnaire Score:  Knowledge Questionnaire Score - 09/11/21 1211       Knowledge Questionnaire Score   Pre Score 23/26: Angina, Nutrition, Exercise             Core Components/Risk Factors/Patient Goals at Admission:  Personal Goals and Risk Factors at Admission - 09/11/21 1233       Core Components/Risk Factors/Patient Goals on Admission    Weight Management Yes;Weight Maintenance    Intervention Weight Management: Develop a combined nutrition and exercise program designed to reach desired caloric intake, while maintaining appropriate intake of nutrient and fiber, sodium and fats, and appropriate energy expenditure required for the weight goal.;Weight Management: Provide education and appropriate resources to help participant work on and attain dietary goals.;Weight Management/Obesity: Establish reasonable short term and long term weight goals.    Admit Weight 152 lb (68.9 kg)    Goal Weight: Short Term 152 lb (68.9 kg)    Goal Weight: Long Term 152 lb (68.9 kg)    Expected Outcomes Short Term: Continue to assess and modify interventions until short term weight is achieved;Long Term: Adherence to nutrition and physical activity/exercise program aimed toward attainment of established weight goal;Weight Maintenance: Understanding of the daily nutrition guidelines, which includes 25-35% calories from fat, 7% or less cal from saturated fats, less than 200mg  cholesterol, less than 1.5gm of sodium, & 5 or more servings of fruits and vegetables daily;Understanding recommendations for meals  to include 15-35% energy as protein, 25-35% energy from fat,  35-60% energy from carbohydrates, less than 200mg  of dietary cholesterol, 20-35 gm of total fiber daily;Understanding of distribution of calorie intake throughout the day with the consumption of 4-5 meals/snacks    Diabetes Yes    Intervention Provide education about signs/symptoms and action to take for hypo/hyperglycemia.;Provide education about proper nutrition, including hydration, and aerobic/resistive exercise prescription along with prescribed medications to achieve blood glucose in normal ranges: Fasting glucose 65-99 mg/dL    Expected Outcomes Short Term: Participant verbalizes understanding of the signs/symptoms and immediate care of hyper/hypoglycemia, proper foot care and importance of medication, aerobic/resistive exercise and nutrition plan for blood glucose control.;Long Term: Attainment of HbA1C < 7%.    Hypertension Yes    Intervention Provide education on lifestyle modifcations including regular physical activity/exercise, weight management, moderate sodium restriction and increased consumption of fresh fruit, vegetables, and low fat dairy, alcohol moderation, and smoking cessation.;Monitor prescription use compliance.    Lipids Yes    Intervention Provide education and support for participant on nutrition & aerobic/resistive exercise along with prescribed medications to achieve LDL 70mg , HDL >40mg .    Expected Outcomes Short Term: Participant states understanding of desired cholesterol values and is compliant with medications prescribed. Participant is following exercise prescription and nutrition guidelines.;Long Term: Cholesterol controlled with medications as prescribed, with individualized exercise RX and with personalized nutrition plan. Value goals: LDL < 70mg , HDL > 40 mg.             Education:Diabetes - Individual verbal and written instruction to review signs/symptoms of diabetes, desired ranges of glucose level fasting, after meals and with exercise. Acknowledge that  pre and post exercise glucose checks will be done for 3 sessions at entry of program. Filer from 09/09/2021 in [facility]  Date 09/09/21  Educator [facility]  Instruction Review Code 1- Verbalizes Understanding       Core Components/Risk Factors/Patient Goals Review:    Core Components/Risk Factors/Patient Goals at Discharge (Final Review):    ITP Comments:  ITP Comments     Row Name 09/09/21 1407 09/11/21 1205         ITP Comments Initial telephone orientation completed. Diagnosis can be found in [facility] 1/13. EP orientation scheduled for Wednesday 2/15 at 10:30 Completed 6MWT and gym orientation. Initial ITP created and sent for review to Dr. Elta Guadeloupe, [facility] Medical Director.               Comments: Initial ITP

## 2021-09-16 ENCOUNTER — Other Ambulatory Visit: Payer: Self-pay

## 2021-09-16 ENCOUNTER — Encounter: Payer: Medicare Other | Admitting: *Deleted

## 2021-09-16 DIAGNOSIS — I213 ST elevation (STEMI) myocardial infarction of unspecified site: Secondary | ICD-10-CM

## 2021-09-16 DIAGNOSIS — Z955 Presence of coronary angioplasty implant and graft: Secondary | ICD-10-CM

## 2021-09-16 LAB — GLUCOSE, CAPILLARY: Glucose-Capillary: 249 mg/dL — ABNORMAL HIGH (ref 70–99)

## 2021-09-16 NOTE — Progress Notes (Signed)
Daily Session Note  Patient Details  Name: Kara Dyer MRN: [id number] Date of Birth: October 14, 1950 Referring Provider:   Flowsheet Row Cardiac Rehab from 09/11/2021 in [facility] Cardiac [facility] Pul[facility]hab  Referring Provider Lillia Corporal Artelia Laroche MD       Encounter Date: 09/16/2021  Check In:  Session Check In - 09/16/21 0932       Check-In   Supervising physician immediately available to respond to emergencies See telemetry face sheet for immediately available ER MD    Location [facility]-Cardiac & Pulmonary Rehab    Staff Present Heath Lark, RN, BSN, Laveda Norman, BS, ACSM CEP, Exercise Physiologist;Kara Eliezer Bottom, MS, ASCM CEP, Exercise Physiologist    Virtual Visit No    Medication changes reported     No    Fall or balance concerns reported    No    Warm-up and Cool-down Performed on first and last piece of equipment    Resistance Training Performed Yes    VAD Patient? No    PAD/SET Patient? No      Pain Assessment   Currently in Pain? No/denies                Social History   Tobacco Use  Smoking Status Never  Smokeless Tobacco Not on file    Goals Met:  Exercise tolerated well Personal goals reviewed No report of concerns or symptoms today  Goals Unmet:  Not Applicable  Comments: First full day of exercise!  Patient was oriented to gym and equipment including functions, settings, policies, and procedures.  Patient's individual exercise prescription and treatment plan were reviewed.  All starting workloads were established based on the results of the 6 minute walk test done at initial orientation visit.  The plan for exercise progression was also introduced and progression will be customized based on patient's performance and goals.    Dr. Emily Filbert is Medical Director for [facility].  Dr. Ottie Glazier is Medical Director for [facility].

## 2021-09-18 ENCOUNTER — Encounter: Payer: Self-pay | Admitting: *Deleted

## 2021-09-18 ENCOUNTER — Other Ambulatory Visit: Payer: Self-pay

## 2021-09-18 DIAGNOSIS — I213 ST elevation (STEMI) myocardial infarction of unspecified site: Secondary | ICD-10-CM | POA: Diagnosis not present

## 2021-09-18 DIAGNOSIS — Z955 Presence of coronary angioplasty implant and graft: Secondary | ICD-10-CM

## 2021-09-18 LAB — GLUCOSE, CAPILLARY
Glucose-Capillary: 247 mg/dL — ABNORMAL HIGH (ref 70–99)
Glucose-Capillary: 206 mg/dL — ABNORMAL HIGH (ref 70–99)

## 2021-09-18 NOTE — Progress Notes (Signed)
Cardiac Individual Treatment Plan  Patient Details  Name: Kara Dyer MRN: [id number] Date of Birth: 03/15/1951 Referring Provider:   Flowsheet Row Cardiac Rehab from 09/11/2021 in [facility] Cardiac and Pulmonary Rehab  Referring Provider Lillia Corporal, Artelia Laroche MD       Initial Encounter Date:  Flowsheet Row Cardiac Rehab from 09/11/2021 in [facility] Cardiac and Pulmonary Rehab  Date 09/11/21       Visit Diagnosis: ST elevation myocardial infarction (STEMI), unspecified artery ([place])  Status post coronary artery stent placement  Patient's Home Medications on Admission:  Current Outpatient Medications:    amLODipine (NORVASC) 10 MG tablet, , Disp: , Rfl: 1   aspirin 81 MG EC tablet, Take by mouth., Disp: , Rfl:    aspirin EC 81 MG tablet, Take by mouth., Disp: , Rfl:    clopidogrel (PLAVIX) 75 MG tablet, Take 75 mg by mouth daily., Disp: , Rfl:    glipiZIDE (GLUCOTROL) 5 MG tablet, Take 5 mg by mouth daily., Disp: , Rfl:    losartan (COZAAR) 25 MG tablet, Take 25 mg by mouth daily., Disp: , Rfl:    metFORMIN (GLUCOPHAGE) 500 MG tablet, , Disp: , Rfl: 4   metoprolol succinate (TOPROL-XL) 50 MG 24 hr tablet, Take 1 tablet by mouth daily., Disp: , Rfl:    Multiple Vitamin (MULTI-VITAMINS) TABS, Take by mouth., Disp: , Rfl:    nitroGLYCERIN (NITROSTAT) 0.4 MG SL tablet, SMARTSIG:1 Tablet(s) Sublingual PRN, Disp: , Rfl:    spironolactone (ALDACTONE) 25 MG tablet, Take 12.5 mg by mouth daily., Disp: , Rfl:   Past Medical History: Past Medical History:  Diagnosis Date   Cancer ([place]) 2014   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      Exercise Target Goals: Exercise Program Goal: Individual exercise prescription set using results from initial 6 min walk test and THRR while considering  patients  activity barriers and safety.   Exercise Prescription Goal: Initial exercise prescription builds to 30-45 minutes a day of aerobic activity, 2-3 days per week.  Home exercise guidelines will be given to patient during program as part of exercise prescription that the participant will acknowledge.   Education: Aerobic Exercise: - Group verbal and visual presentation on the components of exercise prescription. Introduces F.I.T.T principle from ACSM for exercise prescriptions.  Reviews F.I.T.T. principles of aerobic exercise including progression. Written material given at graduation. Flowsheet Row Cardiac Rehab from 09/11/2021 in [facility] Cardiac and Pulmonary Rehab  Education need identified 09/11/21       Education: Resistance Exercise: - Group verbal and visual presentation on the components of exercise prescription. Introduces F.I.T.T principle from ACSM for exercise prescriptions  Reviews F.I.T.T. principles of resistance exercise including progression. Written material given at graduation.    Education: Exercise & Equipment Safety: - Individual verbal instruction and demonstration of equipment use and safety with use of the equipment. Flowsheet Row Cardiac Rehab from 09/11/2021 in [facility] Cardiac and Pulmonary Rehab  Education need identified 09/11/21  Date 09/11/21  Educator [place]  Instruction Review Code 1- Verbalizes Understanding       Education: Exercise Physiology & General Exercise Guidelines: - Group verbal and written instruction with models to review the exercise physiology of the cardiovascular system and associated critical values. Provides general exercise guidelines with specific  guidelines to those with heart or lung disease.  Flowsheet Row Cardiac Rehab from 09/11/2021 in [facility] Cardiac and Pulmonary Rehab  Education need identified 09/11/21       Education: Flexibility, Balance, Mind/Body Relaxation: - Group verbal and visual presentation with interactive activity on the  components of exercise prescription. Introduces F.I.T.T principle from ACSM for exercise prescriptions. Reviews F.I.T.T. principles of flexibility and balance exercise training including progression. Also discusses the mind body connection.  Reviews various relaxation techniques to help reduce and manage stress (i.e. Deep breathing, progressive muscle relaxation, and visualization). Balance handout provided to take home. Written material given at graduation.   Activity Barriers & Risk Stratification:  Activity Barriers & Cardiac Risk Stratification - 09/11/21 1208       Activity Barriers & Cardiac Risk Stratification   Activity Barriers Joint Problems;Other (comment);Muscular Weakness    Comments Bilateral knee pain, neuropathy    Cardiac Risk Stratification Moderate             6 Minute Walk:  6 Minute Walk     Row Name 09/11/21 1210         6 Minute Walk   Phase Initial     Distance 1100 feet     Walk Time 6 minutes     # of Rest Breaks 0     MPH 2.08     METS 2.72     RPE 11     Perceived Dyspnea  0     VO2 Peak 9.53     Symptoms Yes (comment)     Comments left knee pain 2/10     Resting HR 67 bpm     Resting BP 140/72     Resting Oxygen Saturation  99 %     Exercise Oxygen Saturation  during 6 min walk 98 %     Max Ex. HR 90 bpm     Max Ex. BP 164/74     2 Minute Post BP 144/76              Oxygen Initial Assessment:   Oxygen Re-Evaluation:   Oxygen Discharge (Final Oxygen Re-Evaluation):   Initial Exercise Prescription:  Initial Exercise Prescription - 09/11/21 1200       Date of Initial Exercise RX and Referring Provider   Date 09/11/21    Referring Provider Lillia Corporal, Artelia Laroche MD      Oxygen   Maintain Oxygen Saturation 88% or higher      NuStep   Level 2    SPM 80    Minutes 15    METs 2.7      T5 Nustep   Level 1    SPM 80    Minutes 15    METs 2.7      Biostep-RELP   Level 1    SPM 50    Minutes 15    METs 2.7       Track   Laps 28    Minutes 15    METs 2.52      Prescription Details   Frequency (times per week) 3    Duration Progress to 30 minutes of continuous aerobic without signs/symptoms of physical distress      Intensity   THRR 40-80% of Max Heartrate 100 - 133    Ratings of Perceived Exertion 11-13    Perceived Dyspnea 0-4      Progression   Progression Continue to progress workloads to maintain intensity without signs/symptoms of physical distress.  Resistance Training   Training Prescription Yes    Weight 3 lb    Reps 10-15             Perform Capillary Blood Glucose checks as needed.  Exercise Prescription Changes:   Exercise Prescription Changes     Row Name 09/11/21 1200             Response to Exercise   Blood Pressure (Admit) 140/72       Blood Pressure (Exercise) 164/74       Blood Pressure (Exit) 144/76       Heart Rate (Admit) 67 bpm       Heart Rate (Exercise) 90 bpm       Heart Rate (Exit) 71 bpm       Oxygen Saturation (Admit) 99 %       Oxygen Saturation (Exercise) 98 %       Rating of Perceived Exertion (Exercise) 11       Perceived Dyspnea (Exercise) 0       Symptoms Left knee pain 2/10       Comments walk test results                Exercise Comments:   Exercise Comments     Row Name 09/16/21 0933           Exercise Comments First full day of exercise!  Patient was oriented to gym and equipment including functions, settings, policies, and procedures.  Patient's individual exercise prescription and treatment plan were reviewed.  All starting workloads were established based on the results of the 6 minute walk test done at initial orientation visit.  The plan for exercise progression was also introduced and progression will be customized based on patient's performance and goals.                Exercise Goals and Review:   Exercise Goals     Row Name 09/11/21 1232             Exercise Goals   Increase Physical  Activity Yes       Intervention Provide advice, education, support and counseling about physical activity/exercise needs.;Develop an individualized exercise prescription for aerobic and resistive training based on initial evaluation findings, risk stratification, comorbidities and participant's personal goals.       Expected Outcomes Short Term: Attend rehab on a regular basis to increase amount of physical activity.;Long Term: Add in home exercise to make exercise part of routine and to increase amount of physical activity.;Long Term: Exercising regularly at least 3-5 days a week.       Increase Strength and Stamina Yes       Intervention Provide advice, education, support and counseling about physical activity/exercise needs.;Develop an individualized exercise prescription for aerobic and resistive training based on initial evaluation findings, risk stratification, comorbidities and participant's personal goals.       Expected Outcomes Short Term: Increase workloads from initial exercise prescription for resistance, speed, and METs.;Short Term: Perform resistance training exercises routinely during rehab and add in resistance training at home;Long Term: Improve cardiorespiratory fitness, muscular endurance and strength as measured by increased METs and functional capacity (6MWT)       Able to understand and use rate of perceived exertion (RPE) scale Yes       Intervention Provide education and explanation on how to use RPE scale       Expected Outcomes Short Term: Able to use RPE daily in rehab to express subjective  intensity level;Long Term:  Able to use RPE to guide intensity level when exercising independently       Able to understand and use Dyspnea scale Yes       Intervention Provide education and explanation on how to use Dyspnea scale       Expected Outcomes Short Term: Able to use Dyspnea scale daily in rehab to express subjective sense of shortness of breath during exertion;Long Term: Able to  use Dyspnea scale to guide intensity level when exercising independently       Knowledge and understanding of Target Heart Rate Range (THRR) Yes       Intervention Provide education and explanation of THRR including how the numbers were predicted and where they are located for reference       Expected Outcomes Short Term: Able to state/look up THRR;Long Term: Able to use THRR to govern intensity when exercising independently;Short Term: Able to use daily as guideline for intensity in rehab       Able to check pulse independently Yes       Intervention Provide education and demonstration on how to check pulse in carotid and radial arteries.;Review the importance of being able to check your own pulse for safety during independent exercise       Expected Outcomes Short Term: Able to explain why pulse checking is important during independent exercise;Long Term: Able to check pulse independently and accurately       Understanding of Exercise Prescription Yes       Intervention Provide education, explanation, and written materials on patient's individual exercise prescription       Expected Outcomes Short Term: Able to explain program exercise prescription;Long Term: Able to explain home exercise prescription to exercise independently                Exercise Goals Re-Evaluation :  Exercise Goals Re-Evaluation     Row Name 09/16/21 [phone]             Exercise Goal Re-Evaluation   Comments Reviewed RPE and dyspnea scales, THR and program prescription with pt today.  Pt voiced understanding and was given a copy of goals to take home.       Short: Use RPE daily to regulate intensity. Long: Follow program prescription in THR.       Expected Outcomes Short: Use RPE daily to regulate intensity. Long: Follow program prescription in THR.                Discharge Exercise Prescription (Final Exercise Prescription Changes):  Exercise Prescription Changes - 09/11/21 1200       Response to  Exercise   Blood Pressure (Admit) 140/72    Blood Pressure (Exercise) 164/74    Blood Pressure (Exit) 144/76    Heart Rate (Admit) 67 bpm    Heart Rate (Exercise) 90 bpm    Heart Rate (Exit) 71 bpm    Oxygen Saturation (Admit) 99 %    Oxygen Saturation (Exercise) 98 %    Rating of Perceived Exertion (Exercise) 11    Perceived Dyspnea (Exercise) 0    Symptoms Left knee pain 2/10    Comments walk test results             Nutrition:  Target Goals: Understanding of nutrition guidelines, daily intake of sodium 1500mg , cholesterol 200mg , calories 30% from fat and 7% or less from saturated fats, daily to have 5 or more servings of fruits and vegetables.  Education: All About Nutrition: -Group instruction  provided by verbal, written material, interactive activities, discussions, models, and posters to present general guidelines for heart healthy nutrition including fat, fiber, MyPlate, the role of sodium in heart healthy nutrition, utilization of the nutrition label, and utilization of this knowledge for meal planning. Follow up email sent as well. Written material given at graduation. Flowsheet Row Cardiac Rehab from 09/11/2021 in [facility] Cardiac and Pulmonary Rehab  Education need identified 09/11/21       Biometrics:  Pre Biometrics - 09/11/21 1208       Pre Biometrics   Height 5' 4.5" (1.638 m)    Weight 152 lb (68.9 kg)    BMI (Calculated) 25.7    Single Leg Stand 1.5 seconds              Nutrition Therapy Plan and Nutrition Goals:  Nutrition Therapy & Goals - 09/11/21 1212       Intervention Plan   Intervention Prescribe, educate and counsel regarding individualized specific dietary modifications aiming towards targeted core components such as weight, hypertension, lipid management, diabetes, heart failure and other comorbidities.    Expected Outcomes Short Term Goal: Understand basic principles of dietary content, such as calories, fat, sodium, cholesterol and  nutrients.;Short Term Goal: A plan has been developed with personal nutrition goals set during dietitian appointment.;Long Term Goal: Adherence to prescribed nutrition plan.             Nutrition Assessments:  MEDIFICTS Score Key: ?70 Need to make dietary changes  40-70 Heart Healthy Diet ? 40 Therapeutic Level Cholesterol Diet  Flowsheet Row Cardiac Rehab from 09/11/2021 in [facility] Cardiac and Pulmonary Rehab  Picture Your Plate Total Score on Admission 79      Picture Your Plate Scores: <56 Unhealthy dietary pattern with much room for improvement. 41-50 Dietary pattern unlikely to meet recommendations for good health and room for improvement. 51-60 More healthful dietary pattern, with some room for improvement.  >60 Healthy dietary pattern, although there may be some specific behaviors that could be improved.    Nutrition Goals Re-Evaluation:   Nutrition Goals Discharge (Final Nutrition Goals Re-Evaluation):   Psychosocial: Target Goals: Acknowledge presence or absence of significant depression and/or stress, maximize coping skills, provide positive support system. Participant is able to verbalize types and ability to use techniques and skills needed for reducing stress and depression.   Education: Stress, Anxiety, and Depression - Group verbal and visual presentation to define topics covered.  Reviews how body is impacted by stress, anxiety, and depression.  Also discusses healthy ways to reduce stress and to treat/manage anxiety and depression.  Written material given at graduation.   Education: Sleep Hygiene -Provides group verbal and written instruction about how sleep can affect your health.  Define sleep hygiene, discuss sleep cycles and impact of sleep habits. Review good sleep hygiene tips.    Initial Review & Psychosocial Screening:  Initial Psych Review & Screening - 09/09/21 1412       Initial Review   Current issues with Current Stress Concerns    Source of  Stress Concerns Family      Family Dynamics   Good Support System? Yes   husband; two daughters     Barriers   Psychosocial barriers to participate in program There are no identifiable barriers or psychosocial needs.      Screening Interventions   Interventions Encouraged to exercise;Provide feedback about the scores to participant;To provide support and resources with identified psychosocial needs    Expected Outcomes Short Term goal:  Utilizing psychosocial counselor, staff and physician to assist with identification of specific Stressors or current issues interfering with healing process. Setting desired goal for each stressor or current issue identified.;Long Term Goal: Stressors or current issues are controlled or eliminated.;Short Term goal: Identification and review with participant of any Quality of Life or Depression concerns found by scoring the questionnaire.;Long Term goal: The participant improves quality of Life and PHQ9 Scores as seen by post scores and/or verbalization of changes             Quality of Life Scores:   Quality of Life - 09/11/21 1211       Quality of Life   Select Quality of Life      Quality of Life Scores   Health/Function Pre 24.8 %    Socioeconomic Pre 22.5 %    Psych/Spiritual Pre 28.93 %    Family Pre 27.6 %    GLOBAL Pre 25.5 %            Scores of 19 and below usually indicate a poorer quality of life in these areas.  A difference of  2-3 points is a clinically meaningful difference.  A difference of 2-3 points in the total score of the Quality of Life Index has been associated with significant improvement in overall quality of life, self-image, physical symptoms, and general health in studies assessing change in quality of life.  PHQ-9: Recent Review Flowsheet Data     Depression screen [facility] 2/9 09/11/2021 01/17/2016   Decreased Interest 0 0   Down, Depressed, Hopeless 0 0   PHQ - 2 Score 0 0   Altered sleeping 0 -   Tired, decreased  energy 0 -   Change in appetite 0 -   Feeling bad or failure about yourself  0 -   Trouble concentrating 0 -   Moving slowly or fidgety/restless 0 -   Suicidal thoughts 0 -   PHQ-9 Score 0 -   Difficult doing work/chores Not difficult at all -      Interpretation of Total Score  Total Score Depression Severity:  1-4 = Minimal depression, 5-9 = Mild depression, 10-14 = Moderate depression, 15-19 = Moderately severe depression, 20-27 = Severe depression   Psychosocial Evaluation and Intervention:  Psychosocial Evaluation - 09/09/21 1418       Psychosocial Evaluation & Interventions   Comments Hang is coming to cardiac rehab post STEMI with stent. She states she feels so much better and didn't realize how bad she had felt until after her stent. She did have a set back with side effects from [place], but now that she has been switched to Plavix she has felt a lot better. She does state that she has not cared for herself like she should have in the past because she has helped with her grandkids and has been the caretaker for both her and her husband's parents and her sister before they passed. She said this has been a wake up call and she is ready to take care of herself. Her husband has been very supportive and walking with her. Her two daughters are also a great support system. Her biggest stress concern is her grandson who is undergoing testing for autism. She states they are all supportive of each other and knows they will continue to support each other throughout the process. She is very motivated to attend the program so she can work on developing a healthy lifestyle    Expected Outcomes Short: attend cardiac  rehab for education and exercise. Long; develop and maintain positive self care habits.    Continue Psychosocial Services  Follow up required by staff             Psychosocial Re-Evaluation:   Psychosocial Discharge (Final Psychosocial Re-Evaluation):   Vocational  Rehabilitation: Provide vocational rehab assistance to qualifying candidates.   Vocational Rehab Evaluation & Intervention:  Vocational Rehab - 09/09/21 1412       Initial Vocational Rehab Evaluation & Intervention   Assessment shows need for Vocational Rehabilitation No             Education: Education Goals: Education classes will be provided on a variety of topics geared toward better understanding of heart health and risk factor modification. Participant will state understanding/return demonstration of topics presented as noted by education test scores.  Learning Barriers/Preferences:  Learning Barriers/Preferences - 09/09/21 1412       Learning Barriers/Preferences   Learning Barriers None    Learning Preferences None             General Cardiac Education Topics:  AED/CPR: - Group verbal and written instruction with the use of models to demonstrate the basic use of the AED with the basic ABC's of resuscitation.   Anatomy and Cardiac Procedures: - Group verbal and visual presentation and models provide information about basic cardiac anatomy and function. Reviews the testing methods done to diagnose heart disease and the outcomes of the test results. Describes the treatment choices: Medical Management, Angioplasty, or Coronary Bypass Surgery for treating various heart conditions including Myocardial Infarction, Angina, Valve Disease, and Cardiac Arrhythmias.  Written material given at graduation. Flowsheet Row Cardiac Rehab from 09/11/2021 in [facility] Cardiac and Pulmonary Rehab  Education need identified 09/11/21       Medication Safety: - Group verbal and visual instruction to review commonly prescribed medications for heart and lung disease. Reviews the medication, class of the drug, and side effects. Includes the steps to properly store meds and maintain the prescription regimen.  Written material given at graduation.   Intimacy: - Group verbal instruction  through game format to discuss how heart and lung disease can affect sexual intimacy. Written material given at graduation..   Know Your Numbers and Heart Failure: - Group verbal and visual instruction to discuss disease risk factors for cardiac and pulmonary disease and treatment options.  Reviews associated critical values for Overweight/Obesity, Hypertension, Cholesterol, and Diabetes.  Discusses basics of heart failure: signs/symptoms and treatments.  Introduces Heart Failure Zone chart for action plan for heart failure.  Written material given at graduation.   Infection Prevention: - Provides verbal and written material to individual with discussion of infection control including proper hand washing and proper equipment cleaning during exercise session. Flowsheet Row Cardiac Rehab from 09/11/2021 in [facility] Cardiac and Pulmonary Rehab  Education need identified 09/11/21  Date 09/11/21  Educator [place]  Instruction Review Code 1- Verbalizes Understanding       Falls Prevention: - Provides verbal and written material to individual with discussion of falls prevention and safety. Flowsheet Row Cardiac Rehab from 09/11/2021 in [facility] Cardiac and Pulmonary Rehab  Education need identified 09/11/21  Date 09/11/21  Educator [place]  Instruction Review Code 1- Verbalizes Understanding       Other: -Provides group and verbal instruction on various topics (see comments)   Knowledge Questionnaire Score:  Knowledge Questionnaire Score - 09/11/21 1211       Knowledge Questionnaire Score   Pre Score 23/26: Angina, Nutrition, Exercise  Core Components/Risk Factors/Patient Goals at Admission:  Personal Goals and Risk Factors at Admission - 09/11/21 1233       Core Components/Risk Factors/Patient Goals on Admission    Weight Management Yes;Weight Maintenance    Intervention Weight Management: Develop a combined nutrition and exercise program designed to reach desired caloric intake,  while maintaining appropriate intake of nutrient and fiber, sodium and fats, and appropriate energy expenditure required for the weight goal.;Weight Management: Provide education and appropriate resources to help participant work on and attain dietary goals.;Weight Management/Obesity: Establish reasonable short term and long term weight goals.    Admit Weight 152 lb (68.9 kg)    Goal Weight: Short Term 152 lb (68.9 kg)    Goal Weight: Long Term 152 lb (68.9 kg)    Expected Outcomes Short Term: Continue to assess and modify interventions until short term weight is achieved;Long Term: Adherence to nutrition and physical activity/exercise program aimed toward attainment of established weight goal;Weight Maintenance: Understanding of the daily nutrition guidelines, which includes 25-35% calories from fat, 7% or less cal from saturated fats, less than 200mg  cholesterol, less than 1.5gm of sodium, & 5 or more servings of fruits and vegetables daily;Understanding recommendations for meals to include 15-35% energy as protein, 25-35% energy from fat, 35-60% energy from carbohydrates, less than 200mg  of dietary cholesterol, 20-35 gm of total fiber daily;Understanding of distribution of calorie intake throughout the day with the consumption of 4-5 meals/snacks    Diabetes Yes    Intervention Provide education about signs/symptoms and action to take for hypo/hyperglycemia.;Provide education about proper nutrition, including hydration, and aerobic/resistive exercise prescription along with prescribed medications to achieve blood glucose in normal ranges: Fasting glucose 65-99 mg/dL    Expected Outcomes Short Term: Participant verbalizes understanding of the signs/symptoms and immediate care of hyper/hypoglycemia, proper foot care and importance of medication, aerobic/resistive exercise and nutrition plan for blood glucose control.;Long Term: Attainment of HbA1C < 7%.    Hypertension Yes    Intervention Provide education  on lifestyle modifcations including regular physical activity/exercise, weight management, moderate sodium restriction and increased consumption of fresh fruit, vegetables, and low fat dairy, alcohol moderation, and smoking cessation.;Monitor prescription use compliance.    Lipids Yes    Intervention Provide education and support for participant on nutrition & aerobic/resistive exercise along with prescribed medications to achieve LDL 70mg , HDL >40mg .    Expected Outcomes Short Term: Participant states understanding of desired cholesterol values and is compliant with medications prescribed. Participant is following exercise prescription and nutrition guidelines.;Long Term: Cholesterol controlled with medications as prescribed, with individualized exercise RX and with personalized nutrition plan. Value goals: LDL < 70mg , HDL > 40 mg.             Education:Diabetes - Individual verbal and written instruction to review signs/symptoms of diabetes, desired ranges of glucose level fasting, after meals and with exercise. Acknowledge that pre and post exercise glucose checks will be done for 3 sessions at entry of program. [place] from 09/09/2021 in [facility] Cardiac and Pulmonary Rehab  Date 09/09/21  Educator [facility]  Instruction Review Code 1- Verbalizes Understanding       Core Components/Risk Factors/Patient Goals Review:    Core Components/Risk Factors/Patient Goals at Discharge (Final Review):    ITP Comments:  ITP Comments     Row Name 09/09/21 1407 09/11/21 1205 09/16/21 0933 09/18/21 0910     ITP Comments Initial telephone orientation completed. Diagnosis can be found in [facility] 1/13. EP orientation scheduled  for Wednesday 2/15 at 10:30 Completed 6MWT and gym orientation. Initial ITP created and sent for review to Dr. Elta Guadeloupe, [facility] Medical Director. First full day of exercise!  Patient was oriented to gym and equipment including functions, settings, policies, and procedures.   Patient's individual exercise prescription and treatment plan were reviewed.  All starting workloads were established based on the results of the 6 minute walk test done at initial orientation visit.  The plan for exercise progression was also introduced and progression will be customized based on patient's performance and goals. 30 Day review completed. Medical Director ITP review done, changes made as directed, and signed approval by Medical Director.   new to program             Comments:

## 2021-09-18 NOTE — Progress Notes (Signed)
Daily Session Note  Patient Details  Name: Kara Dyer MRN: [id number] Date of Birth: 21-Dec-1950 Referring Provider:   Flowsheet Row Cardiac Rehab from 09/11/2021 in [facility] Cardiac and Pulmonary Rehab  Referring Provider Lillia Corporal Artelia Laroche MD       Encounter Date: 09/18/2021  Check In:  Session Check In - 09/18/21 0953       Check-In   Supervising physician immediately available to respond to emergencies See telemetry face sheet for immediately available ER MD    Location [facility]-Cardiac & Pulmonary Rehab    Staff Present Birdie Sons, MPA, Elveria Rising, BA, ACSM CEP, Exercise Physiologist;Joseph Tessie Fass, [place]    Virtual Visit No    Medication changes reported     No    Fall or balance concerns reported    No    Warm-up and Cool-down Performed on first and last piece of equipment    Resistance Training Performed Yes    VAD Patient? No    PAD/SET Patient? No      Pain Assessment   Currently in Pain? No/denies                Social History   Tobacco Use  Smoking Status Never  Smokeless Tobacco Not on file    Goals Met:  Independence with exercise equipment Exercise tolerated well No report of concerns or symptoms today Strength training completed today  Goals Unmet:  Not Applicable  Comments: Pt able to follow exercise prescription today without complaint.  Will continue to monitor for progression.    Dr. Emily Filbert is Medical Director for [place].  Dr. Ottie Glazier is Medical Director for [facility] Pulmonary Rehabilitation.

## 2021-09-20 ENCOUNTER — Other Ambulatory Visit: Payer: Self-pay

## 2021-09-20 ENCOUNTER — Encounter: Payer: Medicare Other | Admitting: *Deleted

## 2021-09-20 DIAGNOSIS — Z955 Presence of coronary angioplasty implant and graft: Secondary | ICD-10-CM

## 2021-09-20 DIAGNOSIS — I213 ST elevation (STEMI) myocardial infarction of unspecified site: Secondary | ICD-10-CM

## 2021-09-20 LAB — GLUCOSE, CAPILLARY
Glucose-Capillary: 227 mg/dL — ABNORMAL HIGH (ref 70–99)
Glucose-Capillary: 140 mg/dL — ABNORMAL HIGH (ref 70–99)

## 2021-09-20 NOTE — Progress Notes (Signed)
Daily Session Note  Patient Details  Name: Kara Dyer MRN: [id number] Date of Birth: 06/27/1951 Referring Provider:   Flowsheet Row Cardiac Rehab from 09/11/2021 in [facility] Cardiac and Pulmonary Rehab  Referring Provider Lillia Corporal Artelia Laroche MD       Encounter Date: 09/20/2021  Check In:  Session Check In - 09/20/21 0930       Check-In   Supervising physician immediately available to respond to emergencies See telemetry face sheet for immediately available ER MD    Location [facility]-Cardiac & Pulmonary Rehab    Staff Present Renita Papa, RN BSN;Joseph Tessie Fass, RCP,RRT,BSRT;Melissa [place], [place], LDN    Virtual Visit No    Medication changes reported     No    Fall or balance concerns reported    No    Warm-up and Cool-down Performed on first and last piece of equipment    Resistance Training Performed Yes    VAD Patient? No    PAD/SET Patient? No      Pain Assessment   Currently in Pain? No/denies                Social History   Tobacco Use  Smoking Status Never  Smokeless Tobacco Not on file    Goals Met:  Independence with exercise equipment Exercise tolerated well No report of concerns or symptoms today Strength training completed today  Goals Unmet:  Not Applicable  Comments: Pt able to follow exercise prescription today without complaint.  Will continue to monitor for progression.    Dr. Emily Filbert is Medical Director for [place].  Dr. Ottie Glazier is Medical Director for [facility] Pulmonary Rehabilitation.

## 2021-09-23 ENCOUNTER — Encounter: Payer: Medicare Other | Admitting: *Deleted

## 2021-09-23 ENCOUNTER — Other Ambulatory Visit: Payer: Self-pay

## 2021-09-23 DIAGNOSIS — I213 ST elevation (STEMI) myocardial infarction of unspecified site: Secondary | ICD-10-CM | POA: Diagnosis not present

## 2021-09-23 DIAGNOSIS — Z955 Presence of coronary angioplasty implant and graft: Secondary | ICD-10-CM

## 2021-09-23 NOTE — Progress Notes (Signed)
Daily Session Note  Patient Details  Name: Kara Dyer MRN: [id number] Date of Birth: 1951/03/08 Referring Provider:   Flowsheet Row Cardiac Rehab from 09/11/2021 in [facility] Cardiac and Pulmonary Rehab  Referring Provider Lillia Corporal Artelia Laroche MD       Encounter Date: 09/23/2021  Check In:  Session Check In - 09/23/21 0937       Check-In   Supervising physician immediately available to respond to emergencies See telemetry face sheet for immediately available ER MD    Location [facility]-Cardiac & Pulmonary Rehab    Staff Present Heath Lark, RN, BSN, CCRP;Joseph [place], RCP,RRT,BSRT;Kelly [place], [place], ACSM CEP, Exercise Physiologist    Virtual Visit No    Medication changes reported     No    Fall or balance concerns reported    No    Warm-up and Cool-down Performed on first and last piece of equipment    Resistance Training Performed Yes    VAD Patient? No    PAD/SET Patient? No      Pain Assessment   Currently in Pain? No/denies                Social History   Tobacco Use  Smoking Status Never  Smokeless Tobacco Not on file    Goals Met:  Exercise tolerated well No report of concerns or symptoms today  Goals Unmet:  Not Applicable  Comments: Pt able to follow exercise prescription today without complaint.  Will continue to monitor for progression.    Dr. Emily Filbert is Medical Director for [place].  Dr. Ottie Glazier is Medical Director for [facility] Pulmonary Rehabilitation.

## 2021-09-25 ENCOUNTER — Encounter: Payer: Medicare Other | Attending: Cardiology

## 2021-09-25 ENCOUNTER — Other Ambulatory Visit: Payer: Self-pay

## 2021-09-25 DIAGNOSIS — I213 ST elevation (STEMI) myocardial infarction of unspecified site: Secondary | ICD-10-CM | POA: Diagnosis present

## 2021-09-25 DIAGNOSIS — Z955 Presence of coronary angioplasty implant and graft: Secondary | ICD-10-CM | POA: Diagnosis present

## 2021-09-25 NOTE — Progress Notes (Signed)
Daily Session Note ? ?Patient Details  ?Name: Kara Dyer ?MRN: [id number] ?Date of Birth: June 02, 1951 ?Referring Provider:   ?Flowsheet Row Cardiac Rehab from 09/11/2021 in [facility] Cardiac and Pulmonary Rehab  ?Referring Provider Lillia Corporal Artelia Laroche MD  ? ?  ? ? ?Encounter Date: 09/25/2021 ? ?Check In: ? Session Check In - 09/25/21 0917   ? ?  ? Check-In  ? Supervising physician immediately available to respond to emergencies See telemetry face sheet for immediately available ER MD   ? Location [facility]-Cardiac & Pulmonary Rehab   ? Staff Present Birdie Sons, MPA, RN;Joseph [place], RCP,RRT,BSRT;Amanda Sommer, BA, ACSM CEP, Exercise Physiologist   ? Virtual Visit No   ? Medication changes reported     No   ? Fall or balance concerns reported    No   ? Warm-up and Cool-down Performed on first and last piece of equipment   ? Resistance Training Performed Yes   ? VAD Patient? No   ? PAD/SET Patient? No   ?  ? Pain Assessment  ? Currently in Pain? No/denies   ? ?  ?  ? ?  ? ? ? ? ? ?Social History  ? ?Tobacco Use  ?Smoking Status Never  ?Smokeless Tobacco Not on file  ? ? ?Goals Met:  ?Independence with exercise equipment ?Exercise tolerated well ?No report of concerns or symptoms today ?Strength training completed today ? ?Goals Unmet:  ?Not Applicable ? ?Comments: Pt able to follow exercise prescription today without complaint.  Will continue to monitor for progression. ? ? ? ?Dr. Emily Filbert is Medical Director for [place].  ?Dr. Ottie Glazier is Medical Director for [facility] Pulmonary Rehabilitation. ?

## 2021-09-27 ENCOUNTER — Other Ambulatory Visit: Payer: Self-pay

## 2021-09-27 ENCOUNTER — Encounter: Payer: Medicare Other | Admitting: *Deleted

## 2021-09-27 DIAGNOSIS — I213 ST elevation (STEMI) myocardial infarction of unspecified site: Secondary | ICD-10-CM

## 2021-09-27 DIAGNOSIS — Z955 Presence of coronary angioplasty implant and graft: Secondary | ICD-10-CM

## 2021-09-27 NOTE — Progress Notes (Signed)
Daily Session Note ? ?Patient Details  ?Name: Kara Dyer ?MRN: [id number] ?Date of Birth: 1950-12-30 ?Referring Provider:   ?Flowsheet Row Cardiac Rehab from 09/11/2021 in [facility] Cardiac and Pulmonary Rehab  ?Referring Provider Lillia Corporal Artelia Laroche MD  ? ?  ? ? ?Encounter Date: 09/27/2021 ? ?Check In: ? Session Check In - 09/27/21 0912   ? ?  ? Check-In  ? Supervising physician immediately available to respond to emergencies See telemetry face sheet for immediately available ER MD   ? Location [facility]-Cardiac & Pulmonary Rehab   ? Staff Present Renita Papa, RN BSN;Joseph [place], RCP,RRT,BSRT;Jessica [place], [place], [place], [place], CCET   ? Virtual Visit No   ? Medication changes reported     No   ? Fall or balance concerns reported    No   ? Warm-up and Cool-down Performed on first and last piece of equipment   ? Resistance Training Performed Yes   ? VAD Patient? No   ? PAD/SET Patient? No   ?  ? Pain Assessment  ? Currently in Pain? No/denies   ? ?  ?  ? ?  ? ? ? ? ? ?Social History  ? ?Tobacco Use  ?Smoking Status Never  ?Smokeless Tobacco Not on file  ? ? ?Goals Met:  ?Independence with exercise equipment ?Exercise tolerated well ?No report of concerns or symptoms today ?Strength training completed today ? ?Goals Unmet:  ?Not Applicable ? ?Comments: Pt able to follow exercise prescription today without complaint.  Will continue to monitor for progression. ? ? ? ?Dr. Emily Filbert is Medical Director for [place].  ?Dr. Ottie Glazier is Medical Director for [facility] Pulmonary Rehabilitation. ?

## 2021-09-30 ENCOUNTER — Encounter: Payer: Medicare Other | Admitting: *Deleted

## 2021-09-30 ENCOUNTER — Other Ambulatory Visit: Payer: Self-pay

## 2021-09-30 DIAGNOSIS — Z955 Presence of coronary angioplasty implant and graft: Secondary | ICD-10-CM

## 2021-09-30 DIAGNOSIS — I213 ST elevation (STEMI) myocardial infarction of unspecified site: Secondary | ICD-10-CM

## 2021-09-30 NOTE — Progress Notes (Signed)
Daily Session Note ? ?Patient Details  ?Name: Kara Dyer ?MRN: [id number] ?Date of Birth: [birth year] ?Referring Provider:   ?Flowsheet Row Cardiac Rehab from 09/11/2021 in [facility] Cardiac and Pulmonary Rehab  ?Referring Provider Lillia Corporal Artelia Laroche MD  ? ?  ? ? ?Encounter Date: 09/30/2021 ? ?Check In: ? Session Check In - 09/30/21 0932   ? ?  ? Check-In  ? Supervising physician immediately available to respond to emergencies See telemetry face sheet for immediately available ER MD   ? Location [facility]-Cardiac & Pulmonary Rehab   ? Staff Present Heath Lark, RN, BSN, Laveda Norman, BS, ACSM CEP, Exercise Physiologist;Joseph [place], [place]   ? Virtual Visit No   ? Medication changes reported     No   ? Fall or balance concerns reported    No   ? Warm-up and Cool-down Performed on first and last piece of equipment   ? Resistance Training Performed Yes   ? VAD Patient? No   ? PAD/SET Patient? No   ?  ? Pain Assessment  ? Currently in Pain? No/denies   ? ?  ?  ? ?  ? ? ? ? ? ?Social History  ? ?Tobacco Use  ?Smoking Status Never  ?Smokeless Tobacco Not on file  ? ? ?Goals Met:  ?Independence with exercise equipment ?Exercise tolerated well ?No report of concerns or symptoms today ? ?Goals Unmet:  ?Not Applicable ? ?Comments: Pt able to follow exercise prescription today without complaint.  Will continue to monitor for progression. ? ? ? ?Dr. Emily Filbert is Medical Director for [place].  ?Dr. Ottie Glazier is Medical Director for [facility] Pulmonary Rehabilitation. ?

## 2021-10-02 ENCOUNTER — Other Ambulatory Visit: Payer: Self-pay

## 2021-10-02 DIAGNOSIS — I213 ST elevation (STEMI) myocardial infarction of unspecified site: Secondary | ICD-10-CM | POA: Diagnosis not present

## 2021-10-02 DIAGNOSIS — Z955 Presence of coronary angioplasty implant and graft: Secondary | ICD-10-CM

## 2021-10-02 NOTE — Progress Notes (Signed)
Daily Session Note ? ?Patient Details  ?Name: Kara Dyer ?MRN: [id number] ?Date of Birth: 12-Feb-1951 ?Referring Provider:   ?Flowsheet Row Cardiac Rehab from 09/11/2021 in [facility] Cardiac and Pulmonary Rehab  ?Referring Provider Lillia Corporal Artelia Laroche MD  ? ?  ? ? ?Encounter Date: 10/02/2021 ? ?Check In: ? Session Check In - 10/02/21 6286   ? ?  ? Check-In  ? Supervising physician immediately available to respond to emergencies See telemetry face sheet for immediately available ER MD   ? Location [facility]-Cardiac & Pulmonary Rehab   ? Staff Present Birdie Sons, MPA, RN;Jessica Luan [place], MA, RCEP, CCRP, CCET;Joseph [place], [place]   ? Virtual Visit No   ? Medication changes reported     No   ? Fall or balance concerns reported    No   ? Warm-up and Cool-down Performed on first and last piece of equipment   ? Resistance Training Performed Yes   ? VAD Patient? No   ? PAD/SET Patient? No   ?  ? Pain Assessment  ? Currently in Pain? No/denies   ? ?  ?  ? ?  ? ? ? ? ? ?Social History  ? ?Tobacco Use  ?Smoking Status Never  ?Smokeless Tobacco Not on file  ? ? ?Goals Met:  ?Independence with exercise equipment ?Exercise tolerated well ?No report of concerns or symptoms today ?Strength training completed today ? ?Goals Unmet:  ?Not Applicable ? ?Comments: Pt able to follow exercise prescription today without complaint.  Will continue to monitor for progression. ? ? ? ?Dr. Emily Filbert is Medical Director for [place].  ?Dr. Ottie Glazier is Medical Director for [facility] Pulmonary Rehabilitation. ?

## 2021-10-04 ENCOUNTER — Other Ambulatory Visit: Payer: Self-pay

## 2021-10-04 ENCOUNTER — Encounter: Payer: Medicare Other | Admitting: *Deleted

## 2021-10-04 DIAGNOSIS — Z955 Presence of coronary angioplasty implant and graft: Secondary | ICD-10-CM

## 2021-10-04 DIAGNOSIS — I213 ST elevation (STEMI) myocardial infarction of unspecified site: Secondary | ICD-10-CM

## 2021-10-04 NOTE — Progress Notes (Signed)
Daily Session Note ? ?Patient Details  ?Name: Kara Dyer ?MRN: [id number] ?Date of Birth: [birth year] ?Referring Provider:   ?Flowsheet Row Cardiac Rehab from 09/11/2021 in [facility] Cardiac and Pulmonary Rehab  ?Referring Provider Lillia Corporal Artelia Laroche MD  ? ?  ? ? ?Encounter Date: 10/04/2021 ? ?Check In: ? Session Check In - 10/04/21 0920   ? ?  ? Check-In  ? Supervising physician immediately available to respond to emergencies See telemetry face sheet for immediately available ER MD   ? Location [facility]-Cardiac & Pulmonary Rehab   ? Staff Present Renita Papa, RN BSN;Joseph [place], RCP,RRT,BSRT;Jessica [place], [place], [place], [place], CCET   ? Virtual Visit No   ? Medication changes reported     No   ? Fall or balance concerns reported    No   ? Warm-up and Cool-down Performed on first and last piece of equipment   ? Resistance Training Performed Yes   ? VAD Patient? No   ? PAD/SET Patient? No   ?  ? Pain Assessment  ? Currently in Pain? No/denies   ? ?  ?  ? ?  ? ? ? ? ? ?Social History  ? ?Tobacco Use  ?Smoking Status Never  ?Smokeless Tobacco Not on file  ? ? ?Goals Met:  ?Independence with exercise equipment ?Exercise tolerated well ?No report of concerns or symptoms today ?Strength training completed today ? ?Goals Unmet:  ?Not Applicable ? ?Comments: Pt able to follow exercise prescription today without complaint.  Will continue to monitor for progression. ? ? ? ?Dr. Emily Filbert is Medical Director for [place].  ?Dr. Ottie Glazier is Medical Director for [facility] Pulmonary Rehabilitation. ?

## 2021-10-07 ENCOUNTER — Other Ambulatory Visit: Payer: Self-pay

## 2021-10-07 ENCOUNTER — Encounter: Payer: Medicare Other | Admitting: *Deleted

## 2021-10-07 DIAGNOSIS — I213 ST elevation (STEMI) myocardial infarction of unspecified site: Secondary | ICD-10-CM

## 2021-10-07 DIAGNOSIS — Z955 Presence of coronary angioplasty implant and graft: Secondary | ICD-10-CM

## 2021-10-07 NOTE — Progress Notes (Signed)
Completed initial RD consultation ?

## 2021-10-07 NOTE — Progress Notes (Signed)
Daily Session Note ? ?Patient Details  ?Name: Kara Dyer ?MRN: [id number] ?Date of Birth: 11/22/1950 ?Referring Provider:   ?Flowsheet Row Cardiac Rehab from 09/11/2021 in [facility] Cardiac and Pulmonary Rehab  ?Referring Provider Lillia Corporal Artelia Laroche MD  ? ?  ? ? ?Encounter Date: 10/07/2021 ? ?Check In: ? Session Check In - 10/07/21 0940   ? ?  ? Check-In  ? Supervising physician immediately available to respond to emergencies See telemetry face sheet for immediately available ER MD   ? Location [facility]-Cardiac & Pulmonary Rehab   ? Staff Present Nada Maclachlan, BA, ACSM CEP, Exercise Physiologist;Kelly Amedeo Plenty, BS, ACSM CEP, Exercise Physiologist;Alyan Hartline, RN, BSN, CCRP   ? Virtual Visit No   ? Medication changes reported     No   ? Fall or balance concerns reported    No   ? Warm-up and Cool-down Performed on first and last piece of equipment   ? Resistance Training Performed Yes   ? VAD Patient? No   ? PAD/SET Patient? No   ?  ? Pain Assessment  ? Currently in Pain? No/denies   ? ?  ?  ? ?  ? ? ? ? ? ?Social History  ? ?Tobacco Use  ?Smoking Status Never  ?Smokeless Tobacco Not on file  ? ? ?Goals Met:  ?Proper associated with RPD/PD & O2 Sat ?Independence with exercise equipment ?Exercise tolerated well ?No report of concerns or symptoms today ? ?Goals Unmet:  ?Not Applicable ? ?Comments: Pt able to follow exercise prescription today without complaint.  Will continue to monitor for progression. ? ? ? ?Dr. Emily Filbert is Medical Director for [place].  ?Dr. Ottie Glazier is Medical Director for [facility] Pulmonary Rehabilitation. ?

## 2021-10-09 ENCOUNTER — Other Ambulatory Visit: Payer: Self-pay

## 2021-10-09 DIAGNOSIS — Z955 Presence of coronary angioplasty implant and graft: Secondary | ICD-10-CM

## 2021-10-09 DIAGNOSIS — I213 ST elevation (STEMI) myocardial infarction of unspecified site: Secondary | ICD-10-CM | POA: Diagnosis not present

## 2021-10-09 NOTE — Progress Notes (Signed)
Daily Session Note ? ?Patient Details  ?Name: Kara Dyer ?MRN: [id number] ?Date of Birth: 1950-08-25 ?Referring Provider:   ?Flowsheet Row Cardiac Rehab from 09/11/2021 in [facility] Cardiac and Pulmonary Rehab  ?Referring Provider Lillia Corporal Artelia Laroche MD  ? ?  ? ? ?Encounter Date: 10/09/2021 ? ?Check In: ? Session Check In - 10/09/21 0929   ? ?  ? Check-In  ? Supervising physician immediately available to respond to emergencies See telemetry face sheet for immediately available ER MD   ? Location [facility]-Cardiac & Pulmonary Rehab   ? Staff Present Birdie Sons, MPA, RN;Joseph [place], RCP,RRT,BSRT;Jessica [place], MA, RCEP, CCRP, CCET   ? Virtual Visit No   ? Medication changes reported     No   ? Fall or balance concerns reported    No   ? Warm-up and Cool-down Performed on first and last piece of equipment   ? Resistance Training Performed Yes   ? VAD Patient? No   ? PAD/SET Patient? No   ?  ? Pain Assessment  ? Currently in Pain? No/denies   ? ?  ?  ? ?  ? ? ? ? ? ?Social History  ? ?Tobacco Use  ?Smoking Status Never  ?Smokeless Tobacco Not on file  ? ? ?Goals Met:  ?Independence with exercise equipment ?Exercise tolerated well ?No report of concerns or symptoms today ?Strength training completed today ? ?Goals Unmet:  ?Not Applicable ? ?Comments: Pt able to follow exercise prescription today without complaint.  Will continue to monitor for progression. ? ? ? ?Dr. Emily Filbert is Medical Director for [place].  ?Dr. Ottie Glazier is Medical Director for [facility] Pulmonary Rehabilitation. ?

## 2021-10-11 ENCOUNTER — Encounter: Payer: Medicare Other | Admitting: *Deleted

## 2021-10-11 ENCOUNTER — Other Ambulatory Visit: Payer: Self-pay

## 2021-10-11 DIAGNOSIS — Z955 Presence of coronary angioplasty implant and graft: Secondary | ICD-10-CM

## 2021-10-11 DIAGNOSIS — I213 ST elevation (STEMI) myocardial infarction of unspecified site: Secondary | ICD-10-CM | POA: Diagnosis not present

## 2021-10-11 NOTE — Progress Notes (Signed)
Daily Session Note ? ?Patient Details  ?Name: Kara Dyer ?MRN: [id number] ?Date of Birth: June 06, 1951 ?Referring Provider:   ?Flowsheet Row Cardiac Rehab from 09/11/2021 in [facility] Cardiac and Pulmonary Rehab  ?Referring Provider Lillia Corporal Artelia Laroche MD  ? ?  ? ? ?Encounter Date: 10/11/2021 ? ?Check In: ? Session Check In - 10/11/21 0927   ? ?  ? Check-In  ? Supervising physician immediately available to respond to emergencies See telemetry face sheet for immediately available ER MD   ? Location [facility]-Cardiac & Pulmonary Rehab   ? Staff Present Renita Papa, RN BSN;Joseph [place], RCP,RRT,BSRT;Jessica [place], [place], [place], [place], CCET   ? Virtual Visit No   ? Medication changes reported     No   ? Fall or balance concerns reported    No   ? Warm-up and Cool-down Performed on first and last piece of equipment   ? Resistance Training Performed Yes   ? VAD Patient? No   ? PAD/SET Patient? No   ?  ? Pain Assessment  ? Currently in Pain? No/denies   ? ?  ?  ? ?  ? ? ? ? ? ?Social History  ? ?Tobacco Use  ?Smoking Status Never  ?Smokeless Tobacco Not on file  ? ? ?Goals Met:  ?Independence with exercise equipment ?Exercise tolerated well ?No report of concerns or symptoms today ?Strength training completed today ? ?Goals Unmet:  ?Not Applicable ? ?Comments: Pt able to follow exercise prescription today without complaint.  Will continue to monitor for progression. ? ? ? ?Dr. Emily Filbert is Medical Director for [place].  ?Dr. Ottie Glazier is Medical Director for [facility] Pulmonary Rehabilitation. ?

## 2021-10-14 ENCOUNTER — Other Ambulatory Visit: Payer: Self-pay

## 2021-10-14 ENCOUNTER — Encounter: Payer: Medicare Other | Admitting: *Deleted

## 2021-10-14 DIAGNOSIS — Z955 Presence of coronary angioplasty implant and graft: Secondary | ICD-10-CM

## 2021-10-14 DIAGNOSIS — I213 ST elevation (STEMI) myocardial infarction of unspecified site: Secondary | ICD-10-CM

## 2021-10-14 NOTE — Progress Notes (Signed)
Daily Session Note ? ?Patient Details  ?Name: Shasta R Sandefur ?MRN: [id number] ?Date of Birth: 08/10/1950 ?Referring Provider:   ?Flowsheet Row Cardiac Rehab from 09/11/2021 in [facility] Cardiac and Pulmonary Rehab  ?Referring Provider [place], Laura Kristin MD  ? ?  ? ? ?Encounter Date: 10/14/2021 ? ?Check In: ? Session Check In - 10/14/21 0948   ? ?  ? Check-In  ? Supervising physician immediately available to respond to emergencies See telemetry face sheet for immediately available ER MD   ? Location [facility]-Cardiac & Pulmonary Rehab   ? Staff Present Susanne Bice, RN, BSN, CCRP;Kelly [place], BS, ACSM CEP, Exercise Physiologist;Amanda Sommer, BA, ACSM CEP, Exercise Physiologist   ? Virtual Visit No   ? Medication changes reported     No   ? Fall or balance concerns reported    No   ? Warm-up and Cool-down Performed on first and last piece of equipment   ? Resistance Training Performed Yes   ? VAD Patient? No   ? PAD/SET Patient? No   ?  ? Pain Assessment  ? Currently in Pain? No/denies   ? ?  ?  ? ?  ? ? ? ? ? ?Social History  ? ?Tobacco Use  ?Smoking Status Never  ?Smokeless Tobacco Not on file  ? ? ?Goals Met:  ?Independence with exercise equipment ?Exercise tolerated well ?No report of concerns or symptoms today ? ?Goals Unmet:  ?Not Applicable ? ?Comments: Pt able to follow exercise prescription today without complaint.  Will continue to monitor for progression. ? ? ? ?Dr. Mark Miller is Medical Director for [facility] Cardiac Rehabilitation.  ?Dr. Fuad Aleskerov is Medical Director for [facility] Pulmonary Rehabilitation. ?

## 2021-10-16 ENCOUNTER — Encounter: Payer: Self-pay | Admitting: *Deleted

## 2021-10-16 ENCOUNTER — Other Ambulatory Visit: Payer: Self-pay

## 2021-10-16 DIAGNOSIS — Z955 Presence of coronary angioplasty implant and graft: Secondary | ICD-10-CM

## 2021-10-16 DIAGNOSIS — I213 ST elevation (STEMI) myocardial infarction of unspecified site: Secondary | ICD-10-CM | POA: Diagnosis not present

## 2021-10-16 NOTE — Progress Notes (Signed)
Daily Session Note ? ?Patient Details  ?Name: Kara Dyer ?MRN: [id number] ?Date of Birth: Oct 07, 1950 ?Referring Provider:   ?Flowsheet Row Cardiac Rehab from 09/11/2021 in [facility] Cardiac and Pulmonary Rehab  ?Referring Provider Lillia Corporal Artelia Laroche MD  ? ?  ? ? ?Encounter Date: 10/16/2021 ? ?Check In: ? Session Check In - 10/16/21 0933   ? ?  ? Check-In  ? Supervising physician immediately available to respond to emergencies See telemetry face sheet for immediately available ER MD   ? Location [facility]-Cardiac & Pulmonary Rehab   ? Staff Present Birdie Sons, MPA, RN;Joseph Turrell, RCP,RRT,BSRT;Melissa Hunter, RDN, LDN   ? Virtual Visit No   ? Medication changes reported     No   ? Fall or balance concerns reported    No   ? Warm-up and Cool-down Performed on first and last piece of equipment   ? Resistance Training Performed Yes   ? VAD Patient? No   ? PAD/SET Patient? No   ?  ? Pain Assessment  ? Currently in Pain? No/denies   ? ?  ?  ? ?  ? ? ? ? ? ?Social History  ? ?Tobacco Use  ?Smoking Status Never  ?Smokeless Tobacco Not on file  ? ? ?Goals Met:  ?Independence with exercise equipment ?Exercise tolerated well ?No report of concerns or symptoms today ?Strength training completed today ? ?Goals Unmet:  ?Not Applicable ? ?Comments: Pt able to follow exercise prescription today without complaint.  Will continue to monitor for progression. ? ? ? ?Dr. Emily Filbert is Medical Director for [place].  ?Dr. Ottie Glazier is Medical Director for [facility] Pulmonary Rehabilitation. ?

## 2021-10-16 NOTE — Progress Notes (Signed)
Cardiac Individual Treatment Plan ? ?Patient Details  ?Name: Kara Dyer ?MRN: [id number] ?Date of Birth: 07-Feb-1951 ?Referring Provider:   ?Flowsheet Row Cardiac Rehab from 09/11/2021 in [facility] Cardiac and Pulmonary Rehab  ?Referring Provider Lillia Corporal Artelia Laroche MD  ? ?  ? ? ?Initial Encounter Date:  ?Flowsheet Row Cardiac Rehab from 09/11/2021 in [facility] Cardiac and Pulmonary Rehab  ?Date 09/11/21  ? ?  ? ? ?Visit Diagnosis: Status post coronary artery stent placement ? ?ST elevation myocardial infarction (STEMI), unspecified artery ([place]) ? ?Patient's Home Medications on Admission: ? ?Current Outpatient Medications:  ?  Alpha-Lipoic Acid 600 MG CAPS, Take 1 capsule by mouth 2 (two) times daily., Disp: , Rfl:  ?  amLODipine (NORVASC) 10 MG tablet, , Disp: , Rfl: 1 ?  aspirin 81 MG EC tablet, Take by mouth daily., Disp: , Rfl:  ?  clopidogrel (PLAVIX) 75 MG tablet, Take 75 mg by mouth daily., Disp: , Rfl:  ?  glipiZIDE (GLUCOTROL) 5 MG tablet, Take 5 mg by mouth daily., Disp: , Rfl:  ?  losartan (COZAAR) 25 MG tablet, Take 25 mg by mouth daily., Disp: , Rfl:  ?  metFORMIN (GLUCOPHAGE) 500 MG tablet, , Disp: , Rfl: 4 ?  metoprolol succinate (TOPROL-XL) 50 MG 24 hr tablet, Take 1 tablet by mouth daily., Disp: , Rfl:  ?  Multiple Vitamin (MULTI-VITAMINS) TABS, Take by mouth., Disp: , Rfl:  ?  nitroGLYCERIN (NITROSTAT) 0.4 MG SL tablet, SMARTSIG:1 Tablet(s) Sublingual PRN, Disp: , Rfl:  ?  spironolactone (ALDACTONE) 25 MG tablet, Take 12.5 mg by mouth daily., Disp: , Rfl:  ? ?Past Medical History: ?Past Medical History:  ?Diagnosis Date  ? Cancer [facility]) 2014  ? 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 ? ?Tobacco Use: ?Social History  ? ?Tobacco Use  ?Smoking Status Never  ?Smokeless Tobacco Not on file  ? ? ?Labs: ?Review Flowsheet   ? ?    ? View : No data to display.  ?  ?  ?  ?  ?  ? ? ? ?Exercise Target Goals: ?Exercise Program Goal: ?Individual exercise prescription set using results from initial 6 min walk test  and THRR while considering  patient?s activity barriers and safety.  ? ?Exercise Prescription Goal: ?Initial exercise prescription builds to 30-45 minutes a day of aerobic activity, 2-3 days per week.  Home exercise guidelines will be given to patient during program as part of exercise prescription that the participant will acknowledge. ? ? ?Education: Aerobic Exercise: ?- Group verbal and visual presentation on the components of exercise prescription. Introduces F.I.T.T principle from ACSM for exercise prescriptions.  Reviews F.I.T.T. principles of aerobic exercise including progression. Written material given at graduation. ?Flowsheet Row Cardiac Rehab from 10/09/2021 in [facility] Cardiac and Pulmonary Rehab  ?Education need identified 09/11/21  ?Date 09/25/21  ?Educator [facility]  ?Instruction Review Code 1- Verbalizes Understanding  ? ?  ? ? ?Education: Resistance Exercise: ?- Group verbal and visual presentation on the components of exercise prescription. Introduces F.I.T.T principle from ACSM for exercise prescriptions  Reviews F.I.T.T. principles of resistance exercise including progression. Written material given at graduation. ?Flowsheet Row Cardiac Rehab from 10/09/2021 in [facility] Cardiac and Pulmonary Rehab  ?Date 10/02/21  ?Educator AS  ?Instruction Review Code 1- Verbalizes Understanding  ? ?  ? ?  ?Education: Exercise & Equipment Safety: ?- Individual verbal instruction and demonstration of equipment use and safety with use of the equipment. ?Flowsheet Row Cardiac Rehab from 10/09/2021 in [facility] Cardiac  and Pulmonary Rehab  ?Education need identified 09/11/21  ?Date 09/11/21  ?Educator KL  ?Instruction Review Code 1- Verbalizes Understanding  ? ?  ? ? ?Education: Exercise Physiology & General Exercise Guidelines: ?- Group verbal and written instruction with models to review the exercise physiology of the cardiovascular system and associated critical values. Provides general exercise guidelines with specific guidelines to  those with heart or lung disease.  ?Flowsheet Row Cardiac Rehab from 10/09/2021 in [facility] Cardiac and Pulmonary Rehab  ?Education need identified 09/11/21  ?Date 09/18/21  ?Educator [facility]  ?Instruction Review Code 1- Verbalizes Understanding  ? ?  ? ? ?Education: Flexibility, Balance, Mind/Body Relaxation: ?- Group verbal and visual presentation with interactive activity on the components of exercise prescription. Introduces F.I.T.T principle from ACSM for exercise prescriptions. Reviews F.I.T.T. principles of flexibility and balance exercise training including progression. Also discusses the mind body connection.  Reviews various relaxation techniques to help reduce and manage stress (i.e. Deep breathing, progressive muscle relaxation, and visualization). Balance handout provided to take home. Written material given at graduation. ? ? ?Activity Barriers & Risk Stratification: ? Activity Barriers & Cardiac Risk Stratification - 09/11/21 1208   ? ?  ? Activity Barriers & Cardiac Risk Stratification  ? Activity Barriers Joint Problems;Other (comment);Muscular Weakness   ? Comments Bilateral knee pain, neuropathy   ? Cardiac Risk Stratification Moderate   ? ?  ?  ? ?  ? ? ?6 Minute Walk: ? 6 Minute Walk   ? ? [place] Name 09/11/21 1210  ?  ?  ?  ? 6 Minute Walk  ? Phase Initial    ? Distance 1100 feet    ? Walk Time 6 minutes    ? # of Rest Breaks 0    ? MPH 2.08    ? METS 2.72    ? RPE 11    ? Perceived Dyspnea  0    ? VO2 Peak 9.53    ? Symptoms Yes (comment)    ? Comments left knee pain 2/10    ? Resting HR 67 bpm    ? Resting BP 140/72    ? Resting Oxygen Saturation  99 %    ? Exercise Oxygen Saturation  during 6 min walk 98 %    ? Max Ex. HR 90 bpm    ? Max Ex. BP 164/74    ? 2 Minute Post BP 144/76    ? ?  ?  ? ?  ? ? ?Oxygen Initial Assessment: ? ? ?Oxygen Re-Evaluation: ? ? ?Oxygen Discharge (Final Oxygen Re-Evaluation): ? ? ?Initial Exercise Prescription: ? Initial Exercise Prescription - 09/11/21 1200   ? ?  ? Date of  Initial Exercise RX and Referring Provider  ? Date 09/11/21   ? Referring Provider Lillia Corporal Artelia Laroche MD   ?  ? Oxygen  ? Maintain Oxygen Saturation 88% or higher   ?  ? NuStep  ? Level 2   ? SPM 80   ? Minutes 15   ? METs 2.7   ?  ? T5 Nustep  ? Level 1   ? SPM 80   ? Minutes 15   ? METs 2.7   ?  ? Biostep-RELP  ? Level 1   ? SPM 50   ? Minutes 15   ? METs 2.7   ?  ? Track  ? Laps 28   ? Minutes 15   ? METs 2.52   ?  ? Prescription Details  ?  Frequency (times per week) 3   ? Duration Progress to 30 minutes of continuous aerobic without signs/symptoms of physical distress   ?  ? Intensity  ? THRR 40-80% of Max Heartrate 100 - 133   ? Ratings of Perceived Exertion 11-13   ? Perceived Dyspnea 0-4   ?  ? Progression  ? Progression Continue to progress workloads to maintain intensity without signs/symptoms of physical distress.   ?  ? Resistance Training  ? Training Prescription Yes   ? Weight 3 lb   ? Reps 10-15   ? ?  ?  ? ?  ? ? ?Perform Capillary Blood Glucose checks as needed. ? ?Exercise Prescription Changes: ? ? Exercise Prescription Changes   ? ? [place] Name 09/11/21 1200 09/30/21 1400 10/15/21 1300  ?  ?  ?  ? Response to Exercise  ? Blood Pressure (Admit) 140/72 118/64 124/70    ? Blood Pressure (Exercise) 164/74 126/74 --    ? Blood Pressure (Exit) 144/76 116/64 112/58    ? Heart Rate (Admit) 67 bpm 57 bpm 71 bpm    ? Heart Rate (Exercise) 90 bpm 104 bpm 100 bpm    ? Heart Rate (Exit) 71 bpm 80 bpm 74 bpm    ? Oxygen Saturation (Admit) 99 % -- --    ? Oxygen Saturation (Exercise) 98 % -- --    ? Rating of Perceived Exertion (Exercise) '11 15 13    '$ ? Perceived Dyspnea (Exercise) 0 -- --    ? Symptoms Left knee pain 2/10 none none    ? Comments walk test results -- --    ? Duration -- Continue with 30 min of aerobic exercise without signs/symptoms of physical distress. Continue with 30 min of aerobic exercise without signs/symptoms of physical distress.    ? Intensity -- THRR unchanged THRR unchanged    ?  ?  Progression  ? Progression -- Continue to progress workloads to maintain intensity without signs/symptoms of physical distress. Continue to progress workloads to maintain intensity without signs/symptoms of

## 2021-10-18 ENCOUNTER — Encounter: Payer: Medicare Other | Admitting: *Deleted

## 2021-10-18 ENCOUNTER — Other Ambulatory Visit: Payer: Self-pay

## 2021-10-18 DIAGNOSIS — Z955 Presence of coronary angioplasty implant and graft: Secondary | ICD-10-CM

## 2021-10-18 DIAGNOSIS — I213 ST elevation (STEMI) myocardial infarction of unspecified site: Secondary | ICD-10-CM | POA: Diagnosis not present

## 2021-10-18 NOTE — Progress Notes (Signed)
Daily Session Note ? ?Patient Details  ?Name: TEMESHA QUEENER ?MRN: [id number] ?Date of Birth: Jul 12, 1951 ?Referring Provider:   ?Flowsheet Row Cardiac Rehab from 09/11/2021 in [facility] Cardiac and Pulmonary Rehab  ?Referring Provider Lillia Corporal Artelia Laroche MD  ? ?  ? ? ?Encounter Date: 10/18/2021 ? ?Check In: ? Session Check In - 10/18/21 0924   ? ?  ? Check-In  ? Supervising physician immediately available to respond to emergencies See telemetry face sheet for immediately available ER MD   ? Location [facility]-Cardiac & Pulmonary Rehab   ? Staff Present Renita Papa, RN BSN;Joseph [place], RCP,RRT,BSRT;Jessica [place], [place], [place], [place], CCET   ? Virtual Visit No   ? Medication changes reported     No   ? Fall or balance concerns reported    No   ? Warm-up and Cool-down Performed on first and last piece of equipment   ? Resistance Training Performed Yes   ? VAD Patient? No   ? PAD/SET Patient? No   ?  ? Pain Assessment  ? Currently in Pain? No/denies   ? ?  ?  ? ?  ? ? ? ? ? ?Social History  ? ?Tobacco Use  ?Smoking Status Never  ?Smokeless Tobacco Not on file  ? ? ?Goals Met:  ?Independence with exercise equipment ?Exercise tolerated well ?No report of concerns or symptoms today ?Strength training completed today ? ?Goals Unmet:  ?Not Applicable ? ?Comments: Pt able to follow exercise prescription today without complaint.  Will continue to monitor for progression. ? ? ? ?Dr. Emily Filbert is Medical Director for [place].  ?Dr. Ottie Glazier is Medical Director for [facility] Pulmonary Rehabilitation. ?

## 2021-10-21 ENCOUNTER — Other Ambulatory Visit: Payer: Self-pay

## 2021-10-21 ENCOUNTER — Encounter: Payer: Medicare Other | Admitting: *Deleted

## 2021-10-21 DIAGNOSIS — Z955 Presence of coronary angioplasty implant and graft: Secondary | ICD-10-CM

## 2021-10-21 DIAGNOSIS — I213 ST elevation (STEMI) myocardial infarction of unspecified site: Secondary | ICD-10-CM | POA: Diagnosis not present

## 2021-10-21 NOTE — Progress Notes (Signed)
Daily Session Note ? ?Patient Details  ?Name: Kara Dyer ?MRN: [id number] ?Date of Birth: Dec 02, 1950 ?Referring Provider:   ?Flowsheet Row Cardiac Rehab from 09/11/2021 in [facility] Cardiac and Pulmonary Rehab  ?Referring Provider Lillia Corporal Artelia Laroche MD  ? ?  ? ? ?Encounter Date: 10/21/2021 ? ?Check In: ? Session Check In - 10/21/21 0936   ? ?  ? Check-In  ? Supervising physician immediately available to respond to emergencies See telemetry face sheet for immediately available ER MD   ? Location [facility]-Cardiac & Pulmonary Rehab   ? Staff Present Heath Lark, RN, BSN, Lance Sell, BA, ACSM CEP, Exercise Physiologist;Kelly [place], BS, ACSM CEP, Exercise Physiologist   ? Virtual Visit No   ? Medication changes reported     No   ? Fall or balance concerns reported    No   ? Warm-up and Cool-down Performed on first and last piece of equipment   ? Resistance Training Performed Yes   ? VAD Patient? No   ? PAD/SET Patient? No   ?  ? Pain Assessment  ? Currently in Pain? No/denies   ? ?  ?  ? ?  ? ? ? ? ? ?Social History  ? ?Tobacco Use  ?Smoking Status Never  ?Smokeless Tobacco Not on file  ? ? ?Goals Met:  ?Independence with exercise equipment ?Exercise tolerated well ?No report of concerns or symptoms today ? ?Goals Unmet:  ?Not Applicable ? ?Comments: Pt able to follow exercise prescription today without complaint.  Will continue to monitor for progression. ? ? ? ?Dr. Emily Filbert is Medical Director for [place].  ?Dr. Ottie Glazier is Medical Director for [facility] Pulmonary Rehabilitation. ?

## 2021-10-23 ENCOUNTER — Other Ambulatory Visit: Payer: Self-pay

## 2021-10-23 DIAGNOSIS — I213 ST elevation (STEMI) myocardial infarction of unspecified site: Secondary | ICD-10-CM | POA: Diagnosis not present

## 2021-10-23 DIAGNOSIS — Z955 Presence of coronary angioplasty implant and graft: Secondary | ICD-10-CM

## 2021-10-23 NOTE — Progress Notes (Signed)
Daily Session Note ? ?Patient Details  ?Name: Kara Dyer ?MRN: [id number] ?Date of Birth: [birth year] ?Referring Provider:   ?Flowsheet Row Cardiac Rehab from 09/11/2021 in [facility] Cardiac and Pulmonary Rehab  ?Referring Provider Lillia Corporal Artelia Laroche MD  ? ?  ? ? ?Encounter Date: 10/23/2021 ? ?Check In: ? Session Check In - 10/23/21 0923   ? ?  ? Check-In  ? Supervising physician immediately available to respond to emergencies See telemetry face sheet for immediately available ER MD   ? Location [facility]-Cardiac & Pulmonary Rehab   ? Staff Present Birdie Sons, MPA, RN;Joseph [place], RCP,RRT,BSRT;Amanda Sommer, BA, ACSM CEP, Exercise Physiologist   ? Virtual Visit No   ? Medication changes reported     No   ? Fall or balance concerns reported    No   ? Warm-up and Cool-down Performed on first and last piece of equipment   ? Resistance Training Performed Yes   ? VAD Patient? No   ? PAD/SET Patient? No   ?  ? Pain Assessment  ? Currently in Pain? No/denies   ? ?  ?  ? ?  ? ? ? ? ? ?Social History  ? ?Tobacco Use  ?Smoking Status Never  ?Smokeless Tobacco Not on file  ? ? ?Goals Met:  ?Independence with exercise equipment ?Exercise tolerated well ?Personal goals reviewed ?No report of concerns or symptoms today ?Strength training completed today ? ?Goals Unmet:  ?Not Applicable ? ?Comments: Pt able to follow exercise prescription today without complaint.  Will continue to monitor for progression. ? ? ? ?Dr. Emily Filbert is Medical Director for [place].  ?Dr. Ottie Glazier is Medical Director for [facility] Pulmonary Rehabilitation. ?

## 2021-10-25 ENCOUNTER — Encounter: Payer: Medicare Other | Admitting: *Deleted

## 2021-10-25 DIAGNOSIS — Z955 Presence of coronary angioplasty implant and graft: Secondary | ICD-10-CM

## 2021-10-25 DIAGNOSIS — I213 ST elevation (STEMI) myocardial infarction of unspecified site: Secondary | ICD-10-CM | POA: Diagnosis not present

## 2021-10-25 NOTE — Progress Notes (Signed)
Daily Session Note ? ?Patient Details  ?Name: Kara Dyer ?MRN: [id number] ?Date of Birth: [birth year] ?Referring Provider:   ?Flowsheet Row Cardiac Rehab from 09/11/2021 in [facility] Cardiac and Pulmonary Rehab  ?Referring Provider Lillia Corporal Artelia Laroche MD  ? ?  ? ? ?Encounter Date: 10/25/2021 ? ?Check In: ? Session Check In - 10/25/21 0930   ? ?  ? Check-In  ? Supervising physician immediately available to respond to emergencies See telemetry face sheet for immediately available ER MD   ? Location [facility]-Cardiac & Pulmonary Rehab   ? Staff Present Renita Papa, RN BSN;Joseph Bolt, RCP,RRT,BSRT;Melissa [place], [place], LDN   ? Virtual Visit No   ? Medication changes reported     No   ? Fall or balance concerns reported    No   ? Warm-up and Cool-down Performed on first and last piece of equipment   ? Resistance Training Performed Yes   ? VAD Patient? No   ? PAD/SET Patient? No   ?  ? Pain Assessment  ? Currently in Pain? No/denies   ? ?  ?  ? ?  ? ? ? ? ? ?Social History  ? ?Tobacco Use  ?Smoking Status Never  ?Smokeless Tobacco Not on file  ? ? ?Goals Met:  ?Independence with exercise equipment ?Exercise tolerated well ?No report of concerns or symptoms today ?Strength training completed today ? ?Goals Unmet:  ?Not Applicable ? ?Comments: Pt able to follow exercise prescription today without complaint.  Will continue to monitor for progression. ? ? ? ?Dr. Emily Filbert is Medical Director for [place].  ?Dr. Ottie Glazier is Medical Director for [facility] Pulmonary Rehabilitation. ?

## 2021-10-28 ENCOUNTER — Encounter: Payer: Medicare Other | Attending: Cardiology | Admitting: *Deleted

## 2021-10-28 DIAGNOSIS — Z955 Presence of coronary angioplasty implant and graft: Secondary | ICD-10-CM | POA: Diagnosis not present

## 2021-10-28 DIAGNOSIS — I213 ST elevation (STEMI) myocardial infarction of unspecified site: Secondary | ICD-10-CM | POA: Insufficient documentation

## 2021-10-28 NOTE — Progress Notes (Signed)
Daily Session Note ? ?Patient Details  ?Name: Kara Dyer ?MRN: [id number] ?Date of Birth: 11/30/1950 ?Referring Provider:   ?Flowsheet Row Cardiac Rehab from 09/11/2021 in [facility] Cardiac and Pulmonary Rehab  ?Referring Provider Newby, Laura Kristin MD  ? ?  ? ? ?Encounter Date: 10/28/2021 ? ?Check In: ? Session Check In - 10/28/21 0930   ? ?  ? Check-In  ? Supervising physician immediately available to respond to emergencies See telemetry face sheet for immediately available ER MD   ? Location [facility]-Cardiac & Pulmonary Rehab   ? Staff Present Susanne Bice, RN, BSN, CCRP;Kelly Hayes, BS, ACSM CEP, Exercise Physiologist;Amanda Sommer, BA, ACSM CEP, Exercise Physiologist   ? Virtual Visit No   ? Medication changes reported     No   ? Fall or balance concerns reported    No   ? Warm-up and Cool-down Performed on first and last piece of equipment   ? Resistance Training Performed Yes   ? VAD Patient? No   ? PAD/SET Patient? No   ?  ? Pain Assessment  ? Currently in Pain? No/denies   ? ?  ?  ? ?  ? ? ? ? ? ?Social History  ? ?Tobacco Use  ?Smoking Status Never  ?Smokeless Tobacco Not on file  ? ? ?Goals Met:  ?Independence with exercise equipment ?Exercise tolerated well ?No report of concerns or symptoms today ? ?Goals Unmet:  ?Not Applicable ? ?Comments: Pt able to follow exercise prescription today without complaint.  Will continue to monitor for progression. ? ? ? ?Dr. Mark Miller is Medical Director for [facility] Cardiac Rehabilitation.  ?Dr. Fuad Aleskerov is Medical Director for [facility] Pulmonary Rehabilitation. ?

## 2021-10-30 DIAGNOSIS — Z955 Presence of coronary angioplasty implant and graft: Secondary | ICD-10-CM

## 2021-10-30 DIAGNOSIS — I213 ST elevation (STEMI) myocardial infarction of unspecified site: Secondary | ICD-10-CM

## 2021-10-30 NOTE — Progress Notes (Signed)
Daily Session Note ? ?Patient Details  ?Name: Kara Dyer ?MRN: [id number] ?Date of Birth: 1950/12/13 ?Referring Provider:   ?Flowsheet Row Cardiac Rehab from 09/11/2021 in [facility] Cardiac and Pulmonary Rehab  ?Referring Provider Lillia Corporal Artelia Laroche MD  ? ?  ? ? ?Encounter Date: 10/30/2021 ? ?Check In: ? Session Check In - 10/30/21 0930   ? ?  ? Check-In  ? Supervising physician immediately available to respond to emergencies See telemetry face sheet for immediately available ER MD   ? Location [facility]-Cardiac & Pulmonary Rehab   ? Staff Present Birdie Sons, MPA, RN;Joseph Algona, Sharren [place], MS, ASCM CEP, Exercise Physiologist;Melissa Tilford Pillar, RDN, LDN   ? Virtual Visit No   ? Medication changes reported     No   ? Fall or balance concerns reported    No   ? Warm-up and Cool-down Performed on first and last piece of equipment   ? Resistance Training Performed Yes   ? VAD Patient? No   ? PAD/SET Patient? No   ?  ? Pain Assessment  ? Currently in Pain? No/denies   ? ?  ?  ? ?  ? ? ? ? ? ?Social History  ? ?Tobacco Use  ?Smoking Status Never  ?Smokeless Tobacco Not on file  ? ? ?Goals Met:  ?Independence with exercise equipment ?Exercise tolerated well ?No report of concerns or symptoms today ?Strength training completed today ? ?Goals Unmet:  ?Not Applicable ? ?Comments: Pt able to follow exercise prescription today without complaint.  Will continue to monitor for progression. ? ? ? ?Dr. Emily Filbert is Medical Director for [place].  ?Dr. Ottie Glazier is Medical Director for [facility] Pulmonary Rehabilitation. ?

## 2021-11-01 ENCOUNTER — Encounter: Payer: Medicare Other | Admitting: *Deleted

## 2021-11-01 DIAGNOSIS — I213 ST elevation (STEMI) myocardial infarction of unspecified site: Secondary | ICD-10-CM | POA: Diagnosis not present

## 2021-11-01 DIAGNOSIS — Z955 Presence of coronary angioplasty implant and graft: Secondary | ICD-10-CM

## 2021-11-01 NOTE — Progress Notes (Signed)
Daily Session Note ? ?Patient Details  ?Name: Christol R Lagrand ?MRN: [id number] ?Date of Birth: 12/18/1950 ?Referring Provider:   ?Flowsheet Row Cardiac Rehab from 09/11/2021 in [facility] Cardiac and Pulmonary Rehab  ?Referring Provider Newby, Laura Kristin MD  ? ?  ? ? ?Encounter Date: 11/01/2021 ? ?Check In: ? Session Check In - 11/01/21 0939   ? ?  ? Check-In  ? Supervising physician immediately available to respond to emergencies See telemetry face sheet for immediately available ER MD   ? Location [facility]-Cardiac & Pulmonary Rehab   ? Staff Present Meredith Craven, RN BSN;Laureen Brown, BS, RRT, CPFT;Joseph Hood, RCP,RRT,BSRT   ? Virtual Visit No   ? Medication changes reported     No   ? Fall or balance concerns reported    No   ? Warm-up and Cool-down Performed on first and last piece of equipment   ? Resistance Training Performed Yes   ? VAD Patient? No   ? PAD/SET Patient? No   ?  ? Pain Assessment  ? Currently in Pain? No/denies   ? ?  ?  ? ?  ? ? ? ? ? ?Social History  ? ?Tobacco Use  ?Smoking Status Never  ?Smokeless Tobacco Not on file  ? ? ?Goals Met:  ?Independence with exercise equipment ?Exercise tolerated well ?No report of concerns or symptoms today ?Strength training completed today ? ?Goals Unmet:  ?Not Applicable ? ?Comments: Pt able to follow exercise prescription today without complaint.  Will continue to monitor for progression. ? ? ? ?Dr. Mark Miller is Medical Director for [facility].  ?Dr. Fuad Aleskerov is Medical Director for [facility] Pulmonary Rehabilitation. ?

## 2021-11-04 ENCOUNTER — Encounter: Payer: Medicare Other | Admitting: *Deleted

## 2021-11-04 DIAGNOSIS — Z955 Presence of coronary angioplasty implant and graft: Secondary | ICD-10-CM

## 2021-11-04 DIAGNOSIS — I213 ST elevation (STEMI) myocardial infarction of unspecified site: Secondary | ICD-10-CM

## 2021-11-04 NOTE — Progress Notes (Signed)
Daily Session Note ? ?Patient Details  ?Name: Kara Dyer ?MRN: [id number] ?Date of Birth: 08-Dec-1950 ?Referring Provider:   ?Flowsheet Row Cardiac Rehab from 09/11/2021 in [facility] Cardiac and Pulmonary Rehab  ?Referring Provider Lillia Corporal Artelia Laroche MD  ? ?  ? ? ?Encounter Date: 11/04/2021 ? ?Check In: ? Session Check In - 11/04/21 1007   ? ?  ? Check-In  ? Supervising physician immediately available to respond to emergencies See telemetry face sheet for immediately available ER MD   ? Location [facility]-Cardiac & Pulmonary Rehab   ? Staff Present Heath Lark, RN, BSN, CCRP;Laureen Owens Shark, BS, RRT, CPFT;Kelly Amedeo Plenty, BS, ACSM CEP, Exercise Physiologist;Joseph [place], [place]   ? Virtual Visit No   ? Medication changes reported     No   ? Fall or balance concerns reported    No   ? Warm-up and Cool-down Performed on first and last piece of equipment   ? Resistance Training Performed Yes   ? VAD Patient? No   ? PAD/SET Patient? No   ?  ? Pain Assessment  ? Currently in Pain? No/denies   ? ?  ?  ? ?  ? ? ? ? ? ?Social History  ? ?Tobacco Use  ?Smoking Status Never  ?Smokeless Tobacco Not on file  ? ? ?Goals Met:  ?Independence with exercise equipment ?Exercise tolerated well ?No report of concerns or symptoms today ? ?Goals Unmet:  ?Not Applicable ? ?Comments: Pt able to follow exercise prescription today without complaint.  Will continue to monitor for progression. ? ? ? ?Dr. Emily Filbert is Medical Director for [place].  ?Dr. Ottie Glazier is Medical Director for [facility] Pulmonary Rehabilitation. ?

## 2021-11-06 ENCOUNTER — Encounter: Payer: Medicare Other | Admitting: *Deleted

## 2021-11-06 DIAGNOSIS — I213 ST elevation (STEMI) myocardial infarction of unspecified site: Secondary | ICD-10-CM | POA: Diagnosis not present

## 2021-11-06 DIAGNOSIS — Z955 Presence of coronary angioplasty implant and graft: Secondary | ICD-10-CM

## 2021-11-06 NOTE — Progress Notes (Signed)
Daily Session Note ? ?Patient Details  ?Name: Kara Dyer ?MRN: [id number] ?Date of Birth: Nov 26, 1950 ?Referring Provider:   ?Flowsheet Row Cardiac Rehab from 09/11/2021 in [facility] Cardiac and Pulmonary Rehab  ?Referring Provider Lillia Corporal Artelia Laroche MD  ? ?  ? ? ?Encounter Date: 11/06/2021 ? ?Check In: ? Session Check In - 11/06/21 1009   ? ?  ? Check-In  ? Supervising physician immediately available to respond to emergencies See telemetry face sheet for immediately available ER MD   ? Location [facility]-Cardiac & Pulmonary Rehab   ? Staff Present Heath Lark, RN, BSN, CCRP;Amanda Sommer, BA, ACSM CEP, Exercise Physiologist;Melissa Marmet, RDN, LDN   ? Virtual Visit No   ? Medication changes reported     No   ? Fall or balance concerns reported    No   ? Warm-up and Cool-down Performed on first and last piece of equipment   ? Resistance Training Performed Yes   ? VAD Patient? No   ? PAD/SET Patient? No   ?  ? Pain Assessment  ? Currently in Pain? No/denies   ? ?  ?  ? ?  ? ? ? ? ? ?Social History  ? ?Tobacco Use  ?Smoking Status Never  ?Smokeless Tobacco Not on file  ? ? ?Goals Met:  ?Independence with exercise equipment ?Exercise tolerated well ?No report of concerns or symptoms today ? ?Goals Unmet:  ?Not Applicable ? ?Comments: Pt able to follow exercise prescription today without complaint.  Will continue to monitor for progression. ? ? ? ?Dr. Emily Filbert is Medical Director for [place].  ?Dr. Ottie Glazier is Medical Director for [facility] Pulmonary Rehabilitation. ?

## 2021-11-08 ENCOUNTER — Encounter: Payer: Medicare Other | Admitting: *Deleted

## 2021-11-08 DIAGNOSIS — I213 ST elevation (STEMI) myocardial infarction of unspecified site: Secondary | ICD-10-CM

## 2021-11-08 DIAGNOSIS — Z955 Presence of coronary angioplasty implant and graft: Secondary | ICD-10-CM

## 2021-11-08 NOTE — Progress Notes (Signed)
Daily Session Note ? ?Patient Details  ?Name: Kara Dyer ?MRN: [id number] ?Date of Birth: 1950/08/23 ?Referring Provider:   ?Flowsheet Row Cardiac Rehab from 09/11/2021 in [facility] Cardiac and Pulmonary Rehab  ?Referring Provider Lillia Corporal Artelia Laroche MD  ? ?  ? ? ?Encounter Date: 11/08/2021 ? ?Check In: ? Session Check In - 11/08/21 0958   ? ?  ? Check-In  ? Supervising physician immediately available to respond to emergencies See telemetry face sheet for immediately available ER MD   ? Location [facility]-Cardiac & Pulmonary Rehab   ? Staff Present Renita Papa, RN BSN;Joseph Epes, RCP,RRT,BSRT;Melissa Half[place], [place], LDN   ? Virtual Visit No   ? Medication changes reported     No   ? Fall or balance concerns reported    No   ? Warm-up and Cool-down Performed on first and last piece of equipment   ? Resistance Training Performed Yes   ? VAD Patient? No   ? PAD/SET Patient? No   ?  ? Pain Assessment  ? Currently in Pain? No/denies   ? ?  ?  ? ?  ? ? ? ? ? ?Social History  ? ?Tobacco Use  ?Smoking Status Never  ?Smokeless Tobacco Not on file  ? ? ?Goals Met:  ?Independence with exercise equipment ?Exercise tolerated well ?No report of concerns or symptoms today ?Strength training completed today ? ?Goals Unmet:  ?Not Applicable ? ?Comments: Pt able to follow exercise prescription today without complaint.  Will continue to monitor for progression. ? ? ? ?Dr. Emily Filbert is Medical Director for [place].  ?Dr. Ottie Glazier is Medical Director for [facility] Pulmonary Rehabilitation. ?

## 2021-11-11 ENCOUNTER — Encounter: Payer: Medicare Other | Admitting: *Deleted

## 2021-11-11 VITALS — Ht 64.5 in | Wt 157.0 lb

## 2021-11-11 DIAGNOSIS — I213 ST elevation (STEMI) myocardial infarction of unspecified site: Secondary | ICD-10-CM | POA: Diagnosis not present

## 2021-11-11 DIAGNOSIS — Z955 Presence of coronary angioplasty implant and graft: Secondary | ICD-10-CM

## 2021-11-11 NOTE — Progress Notes (Signed)
Daily Session Note ? ?Patient Details  ?Name: Kara Dyer ?MRN: [id number] ?Date of Birth: 1950-11-03 ?Referring Provider:   ?Flowsheet Row Cardiac Rehab from 09/11/2021 in [facility] Cardiac and Pulmonary Rehab  ?Referring Provider Lillia Corporal Artelia Laroche MD  ? ?  ? ? ?Encounter Date: 11/11/2021 ? ?Check In: ? Session Check In - 11/11/21 0930   ? ?  ? Check-In  ? Supervising physician immediately available to respond to emergencies See telemetry face sheet for immediately available ER MD   ? Location [facility]-Cardiac & Pulmonary Rehab   ? Staff Present Heath Lark, RN, BSN, Laveda Norman, BS, ACSM CEP, Exercise Physiologist;Amanda Sommer, [place], ACSM CEP, Exercise Physiologist   ? Virtual Visit No   ? Medication changes reported     No   ? Fall or balance concerns reported    No   ? Warm-up and Cool-down Performed on first and last piece of equipment   ? Resistance Training Performed Yes   ? VAD Patient? No   ? PAD/SET Patient? No   ?  ? Pain Assessment  ? Currently in Pain? No/denies   ? ?  ?  ? ?  ? ? ? ? ? ?Social History  ? ?Tobacco Use  ?Smoking Status Never  ?Smokeless Tobacco Not on file  ? ? ?Goals Met:  ?Independence with exercise equipment ?Exercise tolerated well ?No report of concerns or symptoms today ? ?Goals Unmet:  ?Not Applicable ? ?Comments: Pt able to follow exercise prescription today without complaint.  Will continue to monitor for progression. ? ? ? ?Dr. Emily Filbert is Medical Director for [place].  ?Dr. Ottie Glazier is Medical Director for [facility] Pulmonary Rehabilitation. ?

## 2021-11-13 ENCOUNTER — Encounter: Payer: Self-pay | Admitting: *Deleted

## 2021-11-13 DIAGNOSIS — Z955 Presence of coronary angioplasty implant and graft: Secondary | ICD-10-CM

## 2021-11-13 DIAGNOSIS — I213 ST elevation (STEMI) myocardial infarction of unspecified site: Secondary | ICD-10-CM | POA: Diagnosis not present

## 2021-11-13 NOTE — Progress Notes (Signed)
Daily Session Note ? ?Patient Details  ?Name: Kara Dyer ?MRN: [id number] ?Date of Birth: 22-Aug-1950 ?Referring Provider:   ?Flowsheet Row Cardiac Rehab from 09/11/2021 in [facility] Cardiac and Pulmonary Rehab  ?Referring Provider Lillia Corporal Artelia Laroche MD  ? ?  ? ? ?Encounter Date: 11/13/2021 ? ?Check In: ? Session Check In - 11/13/21 0931   ? ?  ? Check-In  ? Supervising physician immediately available to respond to emergencies See telemetry face sheet for immediately available ER MD   ? Location [facility]-Cardiac & Pulmonary Rehab   ? Staff Present Birdie Sons, MPA, RN;Joseph Sabula, RCP,RRT,BSRT;Amanda Sommer, BA, ACSM CEP, Exercise Physiologist   ? Virtual Visit No   ? Medication changes reported     No   ? Fall or balance concerns reported    No   ? Warm-up and Cool-down Performed on first and last piece of equipment   ? Resistance Training Performed Yes   ? VAD Patient? No   ? PAD/SET Patient? No   ?  ? Pain Assessment  ? Currently in Pain? No/denies   ? ?  ?  ? ?  ? ? ? ? ? ?Social History  ? ?Tobacco Use  ?Smoking Status Never  ?Smokeless Tobacco Not on file  ? ? ?Goals Met:  ?Independence with exercise equipment ?Exercise tolerated well ?No report of concerns or symptoms today ?Strength training completed today ? ?Goals Unmet:  ?Not Applicable ? ?Comments:  Kara Dyer graduated today from  rehab with 36 sessions completed.  Details of the patient's exercise prescription and what She needs to do in order to continue the prescription and progress were discussed with patient.  Patient was given a copy of prescription and goals.  Patient verbalized understanding.  Kara Dyer plans to continue to exercise by walking at home and joining a gym. ? ? ? ?Dr. Emily Filbert is Medical Director for [place].  ?Dr. Ottie Glazier is Medical Director for [facility] Pulmonary Rehabilitation. ?

## 2021-11-13 NOTE — Progress Notes (Signed)
Discharge Progress Report ? ?Patient Details  ?Name: Kara Dyer ?MRN: [id number] ?Date of Birth: 09/09/1950 ?Referring Provider:   ?Flowsheet Row Cardiac Rehab from 09/11/2021 in [facility] Cardiac and Pulmonary Rehab  ?Referring Provider Lillia Corporal Artelia Laroche MD  ? ?  ? ? ? ?Number of Visits: 39 ? ?Reason for Discharge:  ?Patient reached a stable level of exercise. ?Patient independent in their exercise. ?Patient has met program and personal goals. ? ?Smoking History:  ?Social History  ? ?Tobacco Use  ?Smoking Status Never  ?Smokeless Tobacco Not on file  ? ? ?Diagnosis:  ?Status post coronary artery stent placement ? ?ST elevation myocardial infarction (STEMI), unspecified artery ([place]) ? ?ADL UCSD: ? ? ?Initial Exercise Prescription: ? Initial Exercise Prescription - 09/11/21 1200   ? ?  ? Date of Initial Exercise RX and Referring Provider  ? Date 09/11/21   ? Referring Provider Lillia Corporal Artelia Laroche MD   ?  ? Oxygen  ? Maintain Oxygen Saturation 88% or higher   ?  ? NuStep  ? Level 2   ? SPM 80   ? Minutes 15   ? METs 2.7   ?  ? T5 Nustep  ? Level 1   ? SPM 80   ? Minutes 15   ? METs 2.7   ?  ? Biostep-RELP  ? Level 1   ? SPM 50   ? Minutes 15   ? METs 2.7   ?  ? Track  ? Laps 28   ? Minutes 15   ? METs 2.52   ?  ? Prescription Details  ? Frequency (times per week) 3   ? Duration Progress to 30 minutes of continuous aerobic without signs/symptoms of physical distress   ?  ? Intensity  ? THRR 40-80% of Max Heartrate 100 - 133   ? Ratings of Perceived Exertion 11-13   ? Perceived Dyspnea 0-4   ?  ? Progression  ? Progression Continue to progress workloads to maintain intensity without signs/symptoms of physical distress.   ?  ? Resistance Training  ? Training Prescription Yes   ? Weight 3 lb   ? Reps 10-15   ? ?  ?  ? ?  ? ? ?Discharge Exercise Prescription (Final Exercise Prescription Changes): ? Exercise Prescription Changes - 10/28/21 1500   ? ?  ? Response to Exercise  ? Blood Pressure (Admit) 130/78   ? Blood Pressure  (Exit) 132/64   ? Heart Rate (Admit) 70 bpm   ? Heart Rate (Exercise) 97 bpm   ? Heart Rate (Exit) 80 bpm   ? Oxygen Saturation (Admit) 97 %   ? Oxygen Saturation (Exercise) 94 %   ? Oxygen Saturation (Exit) 98 %   ? Rating of Perceived Exertion (Exercise) 14   ? Symptoms none   ? Duration Continue with 30 min of aerobic exercise without signs/symptoms of physical distress.   ? Intensity THRR unchanged   ?  ? Progression  ? Progression Continue to progress workloads to maintain intensity without signs/symptoms of physical distress.   ? Average METs 2.6   ?  ? Resistance Training  ? Training Prescription Yes   ? Weight 3 lb   ? Reps 10-15   ?  ? Interval Training  ? Interval Training No   ?  ? Treadmill  ? MPH 1.8   ? Grade 0.5   ? Minutes 15   ? METs 2.5   ?  ? Recumbant Bike  ?  Level 3   ? [place] 23   ? Minutes 15   ? METs 3.12   ?  ? Biostep-RELP  ? Level 2   ? Minutes 15   ? METs 2   ?  ? Track  ? Laps 33   ? Minutes 15   ? METs 2.79   ?  ? Oxygen  ? Maintain Oxygen Saturation 88% or higher   ? ?  ?  ? ?  ? ? ?Functional Capacity: ? 6 Minute Walk   ? ? [place] Name 09/11/21 1210 11/11/21 1107  ?  ?  ? 6 Minute Walk  ? Phase Initial Discharge   ? Distance 1100 feet 1200 feet   ? Distance % Change -- 8 %   ? Distance Feet Change -- 100 ft   ? Walk Time 6 minutes 6 minutes   ? # of Rest Breaks 0 0   ? MPH 2.08 2.27   ? METS 2.72 3   ? RPE 11 14   ? Perceived Dyspnea  0 1   ? VO2 Peak 9.53 10.66   ? Symptoms Yes (comment) Yes (comment)   ? Comments left knee pain 2/10 knee pain 8/10   ? Resting HR 67 bpm 68 bpm   ? Resting BP 140/72 132/83   ? Resting Oxygen Saturation  99 % 98 %   ? Exercise Oxygen Saturation  during 6 min walk 98 % 94 %   ? Max Ex. HR 90 bpm 106 bpm   ? Max Ex. BP 164/74 164/74   ? 2 Minute Post BP 144/76 --   ? ?  ?  ? ?  ? ? ?Psychological, QOL, Others - Outcomes: ?PHQ 2/9: ? ?  11/13/2021  ? 11:19 AM 09/11/2021  ? 12:05 PM 01/17/2016  ?  3:26 PM  ?Depression screen PHQ 2/9  ?Decreased Interest 0 0 0   ?Down, Depressed, Hopeless 0 0 0  ?PHQ - 2 Score 0 0 0  ?Altered sleeping 1 0   ?Tired, decreased energy 0 0   ?Change in appetite 0 0   ?Feeling bad or failure about yourself  0 0   ?Trouble concentrating 0 0   ?Moving slowly or fidgety/restless 0 0   ?Suicidal thoughts 0 0   ?PHQ-9 Score 1 0   ?Difficult doing work/chores Not difficult at all Not difficult at all   ? ? ?Quality of Life: ? Quality of Life - 11/13/21 1117   ? ?  ? Quality of Life  ? Select Quality of Life   ?  ? Quality of Life Scores  ? Health/Function Pre 24.8 %   ? Health/Function Post 25.83 %   ? Health/Function % Change 4.15 %   ? Socioeconomic Pre 22.5 %   ? Socioeconomic Post 28.79 %   ? Socioeconomic % Change  27.96 %   ? Psych/Spiritual Pre 28.93 %   ? Psych/Spiritual Post 27.86 %   ? Psych/Spiritual % Change -3.7 %   ? Family Pre 27.6 %   ? Family Post 27.6 %   ? Family % Change 0 %   ? GLOBAL Pre 25.5 %   ? GLOBAL Post 27.12 %   ? GLOBAL % Change 6.35 %   ? ?  ?  ? ?  ? ? ? ?Nutrition & Weight - Outcomes: ? Pre Biometrics - 09/11/21 1208   ? ?  ? Pre Biometrics  ? Height 5' 4.5" (1.638 m)   ?  Weight 152 lb (68.9 kg)   ? BMI (Calculated) 25.7   ? Single Leg Stand 1.5 seconds   ? ?  ?  ? ?  ? ? Post Biometrics - 11/11/21 1109   ? ?  ?  Post  Biometrics  ? Height 5' 4.5" (1.638 m)   ? Weight 157 lb (71.2 kg)   ? BMI (Calculated) 26.54   ? Single Leg Stand 5 seconds   ? ?  ?  ? ?  ? ? ?Nutrition: ? Nutrition Therapy & Goals - 10/07/21 0846   ? ?  ? Nutrition Therapy  ? Diet Heart healthy, T2DM   ? Protein (specify units) 65g   ? Fiber 25 grams   ? Whole Grain Foods 3 servings   ? Saturated Fats 16 max. grams   ? Fruits and Vegetables 8 servings/day   ? Sodium 2 grams   ?  ? Personal Nutrition Goals  ? Nutrition Goal ST: Review paperwork, add in 1-2 additional fruit/vegetable servings per day LT: limit saturated fat <16g/day, continue with current changes made, A1C <7, eat 8 fruit/vegetable servings per day.   ? Comments 71 y.o. F admitted to  rehab for s/p stent placement. PMHx includes CAD, T2DM, STEMI, CHF, HLD. Relevant medications includes glipizide, MVI. PYP Score: 79. Vegetables & Fruits 8/12. Breads, Grains & Cereals 9/12. Red & Processed Meat 11/12. Poultry 2/2. Fish & Shellfish 1/4. Beans, Nuts & Seeds 4/4. Milk & Dairy Foods 4/6. Toppings, Oils, Seasonings & Salt 16/20. Sweets, Snacks & Restaurant Food 14/14. Beverages 10/10. Kara Dyer reports her BG has been good after the hospital, she has limited the amount of wheat she eats - it tends to affect her BG more. BG average is not 158, was 368 at the hospital. Her next A1C will be checked at the end of March, was 13 in January. B: She has 1/2 daves killer bread bagel before rehab. She will have scrambled eggs with some bacon 1-2x/week. Fruit and cheese or peanut butter. Oatmeal with blueberries and nuts or agave and cinnamon. L: may skip lunch when she has her grandchildren. Will sometimes have potato with butter or wraps (low carb) (12g fiber) with chicken salad. D: She uses olive oil and has a lot of chicken (grilled or fried (very rarely) or instant pot), pinto beans, variety of vegetables. She uses some salt and pepper with cooking. S: equate keto peanut butter cup (saturated fat 5g). Drinks: water or decaf coffee at breakfast (black) or matcha tea. She has had lower sodium bloodwork and her doctor recommended adding some back in. Discussed heart healthy and diabetes friendly eating.   ?  ? Intervention Plan  ? Intervention Prescribe, educate and counsel regarding individualized specific dietary modifications aiming towards targeted core components such as weight, hypertension, lipid management, diabetes, heart failure and other comorbidities.   ? Expected Outcomes Short Term Goal: Understand basic principles of dietary content, such as calories, fat, sodium, cholesterol and nutrients.;Short Term Goal: A plan has been developed with personal nutrition goals set during dietitian appointment.;Long  Term Goal: Adherence to prescribed nutrition plan.   ? ?  ?  ? ?  ? ? ?Nutrition Discharge: ? ? ?Education Questionnaire Score: ? Knowledge Questionnaire Score - 11/13/21 1116   ? ?  ? Knowledge Cordelia Pen

## 2021-11-13 NOTE — Progress Notes (Signed)
Cardiac Individual Treatment Plan ? ?Patient Details  ?Name: Kara Dyer ?MRN: [id number] ?Date of Birth: May 20, 1951 ?Referring Provider:   ?Flowsheet Row Cardiac Rehab from 09/11/2021 in [facility] Cardiac and Pulmonary Rehab  ?Referring Provider Lillia Corporal Artelia Laroche MD  ? ?  ? ? ?Initial Encounter Date:  ?Flowsheet Row Cardiac Rehab from 09/11/2021 in [facility] Cardiac and Pulmonary Rehab  ?Date 09/11/21  ? ?  ? ? ?Visit Diagnosis: Status post coronary artery stent placement ? ?ST elevation myocardial infarction (STEMI), unspecified artery ([place]) ? ?Patient's Home Medications on Admission: ? ?Current Outpatient Medications:  ?  Alpha-Lipoic Acid 600 MG CAPS, Take 1 capsule by mouth 2 (two) times daily., Disp: , Rfl:  ?  amLODipine (NORVASC) 10 MG tablet, , Disp: , Rfl: 1 ?  aspirin 81 MG EC tablet, Take by mouth daily., Disp: , Rfl:  ?  clopidogrel (PLAVIX) 75 MG tablet, Take 75 mg by mouth daily., Disp: , Rfl:  ?  glipiZIDE (GLUCOTROL) 5 MG tablet, Take 5 mg by mouth daily., Disp: , Rfl:  ?  losartan (COZAAR) 25 MG tablet, Take 25 mg by mouth daily., Disp: , Rfl:  ?  metFORMIN (GLUCOPHAGE) 500 MG tablet, , Disp: , Rfl: 4 ?  metoprolol succinate (TOPROL-XL) 50 MG 24 hr tablet, Take 1 tablet by mouth daily., Disp: , Rfl:  ?  Multiple Vitamin (MULTI-VITAMINS) TABS, Take by mouth., Disp: , Rfl:  ?  nitroGLYCERIN (NITROSTAT) 0.4 MG SL tablet, SMARTSIG:1 Tablet(s) Sublingual PRN, Disp: , Rfl:  ?  spironolactone (ALDACTONE) 25 MG tablet, Take 12.5 mg by mouth daily., Disp: , Rfl:  ? ?Past Medical History: ?Past Medical History:  ?Diagnosis Date  ? Cancer [facility]) 2014  ? 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 ? ?Tobacco Use: ?Social History  ? ?Tobacco Use  ?Smoking Status Never  ?Smokeless Tobacco Not on file  ? ? ?Labs: ?Review Flowsheet   ? ?    ? View : No data to display.  ?  ?  ?  ?  ?  ? ? ? ?Exercise Target Goals: ?Exercise Program Goal: ?Individual exercise prescription set using results from initial 6 min walk test  and THRR while considering  patient?s activity barriers and safety.  ? ?Exercise Prescription Goal: ?Initial exercise prescription builds to 30-45 minutes a day of aerobic activity, 2-3 days per week.  Home exercise guidelines will be given to patient during program as part of exercise prescription that the participant will acknowledge. ? ? ?Education: Aerobic Exercise: ?- Group verbal and visual presentation on the components of exercise prescription. Introduces F.I.T.T principle from ACSM for exercise prescriptions.  Reviews F.I.T.T. principles of aerobic exercise including progression. Written material given at graduation. ?Flowsheet Row Cardiac Rehab from 11/06/2021 in [facility] Cardiac and Pulmonary Rehab  ?Education need identified 09/11/21  ?Date 09/25/21  ?Educator [facility]  ?Instruction Review Code 1- Verbalizes Understanding  ? ?  ? ? ?Education: Resistance Exercise: ?- Group verbal and visual presentation on the components of exercise prescription. Introduces F.I.T.T principle from ACSM for exercise prescriptions  Reviews F.I.T.T. principles of resistance exercise including progression. Written material given at graduation. ?Flowsheet Row Cardiac Rehab from 11/06/2021 in [facility] Cardiac and Pulmonary Rehab  ?Date 10/02/21  ?Educator AS  ?Instruction Review Code 1- Verbalizes Understanding  ? ?  ? ?  ?Education: Exercise & Equipment Safety: ?- Individual verbal instruction and demonstration of equipment use and safety with use of the equipment. ?Flowsheet Row Cardiac Rehab from 11/06/2021 in [facility] Cardiac  and Pulmonary Rehab  ?Education need identified 09/11/21  ?Date 09/11/21  ?Educator KL  ?Instruction Review Code 1- Verbalizes Understanding  ? ?  ? ? ?Education: Exercise Physiology & General Exercise Guidelines: ?- Group verbal and written instruction with models to review the exercise physiology of the cardiovascular system and associated critical values. Provides general exercise guidelines with specific guidelines to  those with heart or lung disease.  ?Flowsheet Row Cardiac Rehab from 11/06/2021 in [facility] Cardiac and Pulmonary Rehab  ?Education need identified 09/11/21  ?Date 09/18/21  ?Educator [facility]  ?Instruction Review Code 1- Verbalizes Understanding  ? ?  ? ? ?Education: Flexibility, Balance, Mind/Body Relaxation: ?- Group verbal and visual presentation with interactive activity on the components of exercise prescription. Introduces F.I.T.T principle from ACSM for exercise prescriptions. Reviews F.I.T.T. principles of flexibility and balance exercise training including progression. Also discusses the mind body connection.  Reviews various relaxation techniques to help reduce and manage stress (i.e. Deep breathing, progressive muscle relaxation, and visualization). Balance handout provided to take home. Written material given at graduation. ?Flowsheet Row Cardiac Rehab from 11/06/2021 in [facility] Cardiac and Pulmonary Rehab  ?Date 10/16/21  ?Educator AS  ?Instruction Review Code 1- Verbalizes Understanding  ? ?  ? ? ?Activity Barriers & Risk Stratification: ? Activity Barriers & Cardiac Risk Stratification - 09/11/21 1208   ? ?  ? Activity Barriers & Cardiac Risk Stratification  ? Activity Barriers Joint Problems;Other (comment);Muscular Weakness   ? Comments Bilateral knee pain, neuropathy   ? Cardiac Risk Stratification Moderate   ? ?  ?  ? ?  ? ? ?6 Minute Walk: ? 6 Minute Walk   ? ? [place] Name 09/11/21 1210 11/11/21 1107  ?  ?  ? 6 Minute Walk  ? Phase Initial Discharge   ? Distance 1100 feet 1200 feet   ? Distance % Change -- 8 %   ? Distance Feet Change -- 100 ft   ? Walk Time 6 minutes 6 minutes   ? # of Rest Breaks 0 0   ? MPH 2.08 2.27   ? METS 2.72 3   ? RPE 11 14   ? Perceived Dyspnea  0 1   ? VO2 Peak 9.53 10.66   ? Symptoms Yes (comment) Yes (comment)   ? Comments left knee pain 2/10 knee pain 8/10   ? Resting HR 67 bpm 68 bpm   ? Resting BP 140/72 132/83   ? Resting Oxygen Saturation  99 % 98 %   ? Exercise Oxygen  Saturation  during 6 min walk 98 % 94 %   ? Max Ex. HR 90 bpm 106 bpm   ? Max Ex. BP 164/74 164/74   ? 2 Minute Post BP 144/76 --   ? ?  ?  ? ?  ? ? ?Oxygen Initial Assessment: ? ? ?Oxygen Re-Evaluation: ? ? ?Oxygen Discharge (Final Oxygen Re-Evaluation): ? ? ?Initial Exercise Prescription: ? Initial Exercise Prescription - 09/11/21 1200   ? ?  ? Date of Initial Exercise RX and Referring Provider  ? Date 09/11/21   ? Referring Provider Lillia Corporal Artelia Laroche MD   ?  ? Oxygen  ? Maintain Oxygen Saturation 88% or higher   ?  ? NuStep  ? Level 2   ? SPM 80   ? Minutes 15   ? METs 2.7   ?  ? T5 Nustep  ? Level 1   ? SPM 80   ? Minutes 15   ?  METs 2.7   ?  ? Biostep-RELP  ? Level 1   ? SPM 50   ? Minutes 15   ? METs 2.7   ?  ? Track  ? Laps 28   ? Minutes 15   ? METs 2.52   ?  ? Prescription Details  ? Frequency (times per week) 3   ? Duration Progress to 30 minutes of continuous aerobic without signs/symptoms of physical distress   ?  ? Intensity  ? THRR 40-80% of Max Heartrate 100 - 133   ? Ratings of Perceived Exertion 11-13   ? Perceived Dyspnea 0-4   ?  ? Progression  ? Progression Continue to progress workloads to maintain intensity without signs/symptoms of physical distress.   ?  ? Resistance Training  ? Training Prescription Yes   ? Weight 3 lb   ? Reps 10-15   ? ?  ?  ? ?  ? ? ?Perform Capillary Blood Glucose checks as needed. ? ?Exercise Prescription Changes: ? ? Exercise Prescription Changes   ? ? [place] Name 09/11/21 1200 09/30/21 1400 10/15/21 1300 10/28/21 1500  ?  ?  ? Response to Exercise  ? Blood Pressure (Admit) 140/72 118/64 124/70 130/78   ? Blood Pressure (Exercise) 164/74 126/74 -- --   ? Blood Pressure (Exit) 144/76 116/64 112/58 132/64   ? Heart Rate (Admit) 67 bpm 57 bpm 71 bpm 70 bpm   ? Heart Rate (Exercise) 90 bpm 104 bpm 100 bpm 97 bpm   ? Heart Rate (Exit) 71 bpm 80 bpm 74 bpm 80 bpm   ? Oxygen Saturation (Admit) 99 % -- -- 97 %   ? Oxygen Saturation (Exercise) 98 % -- -- 94 %   ? Oxygen  Saturation (Exit) -- -- -- 98 %   ? Rating of Perceived Exertion (Exercise) '11 15 13 14   '$ ? Perceived Dyspnea (Exercise) 0 -- -- --   ? Symptoms Left knee pain 2/10 none none none   ? Comments walk test results -- -- -

## 2021-11-13 NOTE — Patient Instructions (Signed)
Discharge Patient Instructions ? ?Patient Details  ?Name: Kara Dyer ?MRN: [id number] ?Date of Birth: 1951-02-04 ?Referring Provider:  Joaquim Nam, MD ? ? ?Number of Visits: 53 ? ?Reason for Discharge:  ?Patient reached a stable level of exercise. ?Patient independent in their exercise. ?Patient has met program and personal goals. ? ?Smoking History:  ?Social History  ? ?Tobacco Use  ?Smoking Status Never  ?Smokeless Tobacco Not on file  ? ? ?Diagnosis:  ?Status post coronary artery stent placement ? ?ST elevation myocardial infarction (STEMI), unspecified artery ([place]) ? ?Initial Exercise Prescription: ? Initial Exercise Prescription - 09/11/21 1200   ? ?  ? Date of Initial Exercise RX and Referring Provider  ? Date 09/11/21   ? Referring Provider Kara Corporal Artelia Laroche MD   ?  ? Oxygen  ? Maintain Oxygen Saturation 88% or higher   ?  ? NuStep  ? Level 2   ? SPM 80   ? Minutes 15   ? METs 2.7   ?  ? T5 Nustep  ? Level 1   ? SPM 80   ? Minutes 15   ? METs 2.7   ?  ? Biostep-RELP  ? Level 1   ? SPM 50   ? Minutes 15   ? METs 2.7   ?  ? Track  ? Laps 28   ? Minutes 15   ? METs 2.52   ?  ? Prescription Details  ? Frequency (times per week) 3   ? Duration Progress to 30 minutes of continuous aerobic without signs/symptoms of physical distress   ?  ? Intensity  ? THRR 40-80% of Max Heartrate 100 - 133   ? Ratings of Perceived Exertion 11-13   ? Perceived Dyspnea 0-4   ?  ? Progression  ? Progression Continue to progress workloads to maintain intensity without signs/symptoms of physical distress.   ?  ? Resistance Training  ? Training Prescription Yes   ? Weight 3 lb   ? Reps 10-15   ? ?  ?  ? ?  ? ? ?Discharge Exercise Prescription (Final Exercise Prescription Changes): ? Exercise Prescription Changes - 10/28/21 1500   ? ?  ? Response to Exercise  ? Blood Pressure (Admit) 130/78   ? Blood Pressure (Exit) 132/64   ? Heart Rate (Admit) 70 bpm   ? Heart Rate (Exercise) 97 bpm   ? Heart Rate (Exit) 80 bpm   ? Oxygen  Saturation (Admit) 97 %   ? Oxygen Saturation (Exercise) 94 %   ? Oxygen Saturation (Exit) 98 %   ? Rating of Perceived Exertion (Exercise) 14   ? Symptoms none   ? Duration Continue with 30 min of aerobic exercise without signs/symptoms of physical distress.   ? Intensity THRR unchanged   ?  ? Progression  ? Progression Continue to progress workloads to maintain intensity without signs/symptoms of physical distress.   ? Average METs 2.6   ?  ? Resistance Training  ? Training Prescription Yes   ? Weight 3 lb   ? Reps 10-15   ?  ? Interval Training  ? Interval Training No   ?  ? Treadmill  ? MPH 1.8   ? Grade 0.5   ? Minutes 15   ? METs 2.5   ?  ? Recumbant Bike  ? Level 3   ? Watts 23   ? Minutes 15   ? METs 3.12   ?  ? Biostep-RELP  ?  Level 2   ? Minutes 15   ? METs 2   ?  ? Track  ? Laps 33   ? Minutes 15   ? METs 2.79   ?  ? Oxygen  ? Maintain Oxygen Saturation 88% or higher   ? ?  ?  ? ?  ? ? ?Functional Capacity: ? 6 Minute Walk   ? ? [place] Name 09/11/21 1210 11/11/21 1107  ?  ?  ? 6 Minute Walk  ? Phase Initial Discharge   ? Distance 1100 feet 1200 feet   ? Distance % Change -- 8 %   ? Distance Feet Change -- 100 ft   ? Walk Time 6 minutes 6 minutes   ? # of Rest Breaks 0 0   ? MPH 2.08 2.27   ? METS 2.72 3   ? RPE 11 14   ? Perceived Dyspnea  0 1   ? VO2 Peak 9.53 10.66   ? Symptoms Yes (comment) Yes (comment)   ? Comments left knee pain 2/10 knee pain 8/10   ? Resting HR 67 bpm 68 bpm   ? Resting BP 140/72 132/83   ? Resting Oxygen Saturation  99 % 98 %   ? Exercise Oxygen Saturation  during 6 min walk 98 % 94 %   ? Max Ex. HR 90 bpm 106 bpm   ? Max Ex. BP 164/74 164/74   ? 2 Minute Post BP 144/76 --   ? ?  ?  ? ?  ? ? ?Quality of Life: ? Quality of Life - 09/11/21 1211   ? ?  ? Quality of Life  ? Select Quality of Life   ?  ? Quality of Life Scores  ? Health/Function Pre 24.8 %   ? Socioeconomic Pre 22.5 %   ? Psych/Spiritual Pre 28.93 %   ? Family Pre 27.6 %   ? GLOBAL Pre 25.5 %   ? ?  ?  ? ?  ? ? ?   ? ? ?Nutrition & Weight - Outcomes: ? Pre Biometrics - 09/11/21 1208   ? ?  ? Pre Biometrics  ? Height 5' 4.5" (1.638 m)   ? Weight 152 lb (68.9 kg)   ? BMI (Calculated) 25.7   ? Single Leg Stand 1.5 seconds   ? ?  ?  ? ?  ? ? Post Biometrics - 11/11/21 1109   ? ?  ?  Post  Biometrics  ? Height 5' 4.5" (1.638 m)   ? Weight 157 lb (71.2 kg)   ? BMI (Calculated) 26.54   ? Single Leg Stand 5 seconds   ? ?  ?  ? ?  ? ? ?Nutrition: ? Nutrition Therapy & Goals - 10/07/21 0846   ? ?  ? Nutrition Therapy  ? Diet Heart healthy, T2DM   ? Protein (specify units) 65g   ? Fiber 25 grams   ? Whole Grain Foods 3 servings   ? Saturated Fats 16 max. grams   ? Fruits and Vegetables 8 servings/day   ? Sodium 2 grams   ?  ? Personal Nutrition Goals  ? Nutrition Goal ST: Review paperwork, add in 1-2 additional fruit/vegetable servings per day LT: limit saturated fat <16g/day, continue with current changes made, A1C <7, eat 8 fruit/vegetable servings per day.   ? Comments 71 y.o. F admitted to rehab for s/p stent placement. PMHx includes CAD, T2DM, STEMI, CHF, HLD. Relevant medications includes glipizide, MVI. PYP  Score: 79. Vegetables & Fruits 8/12. Breads, Grains & Cereals 9/12. Red & Processed Meat 11/12. Poultry 2/2. Fish & Shellfish 1/4. Beans, Nuts & Seeds 4/4. Milk & Dairy Foods 4/6. Toppings, Oils, Seasonings & Salt 16/20. Sweets, Snacks & Restaurant Food 14/14. Beverages 10/10. Kara Dyer reports her BG has been good after the hospital, she has limited the amount of wheat she eats - it tends to affect her BG more. BG average is not 158, was 368 at the hospital. Her next A1C will be checked at the end of March, was 13 in January. B: She has 1/2 daves killer bread bagel before rehab. She will have scrambled eggs with some bacon 1-2x/week. Fruit and cheese or peanut butter. Oatmeal with blueberries and nuts or agave and cinnamon. L: may skip lunch when she has her grandchildren. Will sometimes have potato with butter or wraps (low  carb) (12g fiber) with chicken salad. D: She uses olive oil and has a lot of chicken (grilled or fried (very rarely) or instant pot), pinto beans, variety of vegetables. She uses some salt and pepper with cooking. S: equate keto peanut butter cup (saturated fat 5g). Drinks: water or decaf coffee at breakfast (black) or matcha tea. She has had lower sodium bloodwork and her doctor recommended adding some back in. Discussed heart healthy and diabetes friendly eating.   ?  ? Intervention Plan  ? Intervention Prescribe, educate and counsel regarding individualized specific dietary modifications aiming towards targeted core components such as weight, hypertension, lipid management, diabetes, heart failure and other comorbidities.   ? Expected Outcomes Short Term Goal: Understand basic principles of dietary content, such as calories, fat, sodium, cholesterol and nutrients.;Short Term Goal: A plan has been developed with personal nutrition goals set during dietitian appointment.;Long Term Goal: Adherence to prescribed nutrition plan.   ? ?  ?  ? ?  ? ? ?Goals reviewed with patient; copy given to patient. ?

## 2022-06-24 ENCOUNTER — Ambulatory Visit
Admission: EM | Admit: 2022-06-24 | Discharge: 2022-06-24 | Disposition: A | Discharge status: Home / Self Care | Payer: Medicare Other | Attending: Emergency Medicine | Admitting: Emergency Medicine

## 2022-06-24 ENCOUNTER — Ambulatory Visit (INDEPENDENT_AMBULATORY_CARE_PROVIDER_SITE_OTHER): Payer: Medicare Other

## 2022-06-24 DIAGNOSIS — S39012A Strain of muscle, fascia and tendon of lower back, initial encounter: Secondary | ICD-10-CM

## 2022-06-24 DIAGNOSIS — M542 Cervicalgia: Secondary | ICD-10-CM

## 2022-06-24 DIAGNOSIS — I10 Essential (primary) hypertension: Secondary | ICD-10-CM | POA: Diagnosis not present

## 2022-06-24 DIAGNOSIS — M545 Low back pain, unspecified: Secondary | ICD-10-CM | POA: Diagnosis not present

## 2022-06-24 MED ORDER — TIZANIDINE HCL 4 MG PO TABS: 4.0000 mg | ORAL_TABLET | Freq: Three times a day (TID) | ORAL | 0 refills | Status: AC | PRN
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
